# Patient Record
Sex: Male | Born: 1985 | Race: Black or African American | Hispanic: No | Marital: Married | Smoking: Former smoker
Health system: Southern US, Community
[De-identification: ages and names within clinical notes are randomized; demographics above are authoritative.]

## PROBLEM LIST (undated history)

## (undated) DIAGNOSIS — K5090 Crohn's disease, unspecified, without complications: Secondary | ICD-10-CM

## (undated) DIAGNOSIS — G47419 Narcolepsy without cataplexy: Secondary | ICD-10-CM

## (undated) DIAGNOSIS — K219 Gastro-esophageal reflux disease without esophagitis: Secondary | ICD-10-CM

## (undated) DIAGNOSIS — J45909 Unspecified asthma, uncomplicated: Secondary | ICD-10-CM

## (undated) HISTORY — PX: TONSILLECTOMY: SUR1361

---
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
## 2009-08-07 ENCOUNTER — Emergency Department (HOSPITAL_COMMUNITY): Admission: EM | Admit: 2009-08-07 | Discharge: 2009-08-07 | Payer: Self-pay | Admitting: Family Medicine

## 2010-04-11 ENCOUNTER — Encounter: Payer: Self-pay | Admitting: Internal Medicine

## 2013-03-23 ENCOUNTER — Emergency Department (INDEPENDENT_AMBULATORY_CARE_PROVIDER_SITE_OTHER)
Admission: EM | Admit: 2013-03-23 | Discharge: 2013-03-23 | Disposition: A | Discharge status: Home / Self Care | Payer: Self-pay | Source: Home / Self Care | Attending: Family Medicine | Admitting: Family Medicine

## 2013-03-23 ENCOUNTER — Encounter (HOSPITAL_COMMUNITY): Payer: Self-pay | Admitting: Emergency Medicine

## 2013-03-23 DIAGNOSIS — H6120 Impacted cerumen, unspecified ear: Secondary | ICD-10-CM

## 2013-03-23 DIAGNOSIS — H6123 Impacted cerumen, bilateral: Secondary | ICD-10-CM

## 2013-03-23 NOTE — Discharge Instructions (Signed)
Return as needed

## 2013-03-23 NOTE — ED Provider Notes (Signed)
CSN: [id number]     Arrival date & time 03/23/13  1029 History   First MD Initiated Contact with Patient 03/23/13 1226     Chief Complaint  Patient presents with  . Otalgia   (Consider location/radiation/quality/duration/timing/severity/associated sxs/prior Treatment) Patient is a 28 y.o. male presenting with ear pain. The history is provided by the patient.  Otalgia Location:  Bilateral Behind ear:  No abnormality Quality:  Dull Severity:  No pain Onset quality:  Gradual Duration:  1 week Progression:  Unchanged Chronicity:  New Associated symptoms: no ear discharge and no fever     History reviewed. No pertinent past medical history. History reviewed. No pertinent past surgical history. History reviewed. No pertinent family history. History  Substance Use Topics  . Smoking status: Never Smoker   . Smokeless tobacco: Not on file  . Alcohol Use: Yes    Review of Systems  Constitutional: Negative.  Negative for fever.  HENT: Positive for ear pain. Negative for ear discharge.   Eyes: Negative.     Allergies  Review of patient's allergies indicates no known allergies.  Home Medications  No current outpatient prescriptions on file. BP 136/89  Pulse 66  Temp(Src) 97.3 F (36.3 C) (Oral)  Resp 18  SpO2 97% Physical Exam  Nursing note and vitals reviewed. Constitutional: He appears well-developed and well-nourished. No distress.  HENT:  Head: Normocephalic.  Right Ear: No drainage, swelling or tenderness. Decreased hearing is noted.  Left Ear: No drainage, swelling or tenderness. Decreased hearing is noted.  Ears:  Nose: Nose normal.  Mouth/Throat: Oropharynx is clear and moist.    ED Course  Procedures (including critical care time) Labs Review Labs Reviewed - No data to display Imaging Review No results found.  EKG Interpretation    Date/Time:    Ventricular Rate:    PR Interval:    QRS Duration:   QT Interval:    QTC Calculation:   R Axis:       Text Interpretation:              MDM  Sx and hearing improved after irrg cleaning.    Billy Fischer, MD 03/23/13 1235

## 2013-03-23 NOTE — ED Notes (Signed)
Pt  Reports  Pain  r  Earache      With  Pressure  Sensation    Wax  Buildup            And  Decreased    Hearing  In the  Affected  Ear        Symptoms  X  About  1  Week   unreleived  By  [employer]

## 2015-01-27 DIAGNOSIS — E66813 Obesity, class 3: Secondary | ICD-10-CM | POA: Insufficient documentation

## 2015-01-27 DIAGNOSIS — K5010 Crohn's disease of large intestine without complications: Secondary | ICD-10-CM | POA: Insufficient documentation

## 2015-06-08 DIAGNOSIS — G4710 Hypersomnia, unspecified: Secondary | ICD-10-CM | POA: Insufficient documentation

## 2015-07-31 ENCOUNTER — Encounter (HOSPITAL_COMMUNITY): Payer: Self-pay | Admitting: *Deleted

## 2015-07-31 ENCOUNTER — Inpatient Hospital Stay (HOSPITAL_COMMUNITY)
Admission: EM | Admit: 2015-07-31 | Discharge: 2015-08-04 | DRG: 059 | Disposition: A | Discharge status: Home / Self Care | Payer: 59 | Attending: Internal Medicine | Admitting: Internal Medicine

## 2015-07-31 ENCOUNTER — Emergency Department (HOSPITAL_COMMUNITY)
Admission: EM | Admit: 2015-07-31 | Discharge: 2015-07-31 | Disposition: A | Discharge status: Home / Self Care | Payer: 59 | Source: Home / Self Care | Attending: Emergency Medicine | Admitting: Emergency Medicine

## 2015-07-31 ENCOUNTER — Emergency Department (HOSPITAL_COMMUNITY): Payer: 59

## 2015-07-31 ENCOUNTER — Encounter (HOSPITAL_COMMUNITY): Payer: Self-pay | Admitting: Emergency Medicine

## 2015-07-31 DIAGNOSIS — K50919 Crohn's disease, unspecified, with unspecified complications: Secondary | ICD-10-CM | POA: Diagnosis not present

## 2015-07-31 DIAGNOSIS — G47419 Narcolepsy without cataplexy: Secondary | ICD-10-CM | POA: Diagnosis present

## 2015-07-31 DIAGNOSIS — K219 Gastro-esophageal reflux disease without esophagitis: Secondary | ICD-10-CM | POA: Diagnosis present

## 2015-07-31 DIAGNOSIS — E669 Obesity, unspecified: Secondary | ICD-10-CM | POA: Insufficient documentation

## 2015-07-31 DIAGNOSIS — H53412 Scotoma involving central area, left eye: Secondary | ICD-10-CM | POA: Insufficient documentation

## 2015-07-31 DIAGNOSIS — H469 Unspecified optic neuritis: Secondary | ICD-10-CM | POA: Insufficient documentation

## 2015-07-31 DIAGNOSIS — K5010 Crohn's disease of large intestine without complications: Secondary | ICD-10-CM | POA: Diagnosis present

## 2015-07-31 DIAGNOSIS — H4612 Retrobulbar neuritis, left eye: Secondary | ICD-10-CM

## 2015-07-31 DIAGNOSIS — K5090 Crohn's disease, unspecified, without complications: Secondary | ICD-10-CM | POA: Diagnosis present

## 2015-07-31 DIAGNOSIS — G35 Multiple sclerosis: Secondary | ICD-10-CM | POA: Diagnosis not present

## 2015-07-31 DIAGNOSIS — H538 Other visual disturbances: Secondary | ICD-10-CM | POA: Diagnosis not present

## 2015-07-31 DIAGNOSIS — Z79899 Other long term (current) drug therapy: Secondary | ICD-10-CM

## 2015-07-31 DIAGNOSIS — H539 Unspecified visual disturbance: Secondary | ICD-10-CM | POA: Insufficient documentation

## 2015-07-31 HISTORY — DX: Narcolepsy without cataplexy: G47.419

## 2015-07-31 HISTORY — DX: Crohn's disease, unspecified, without complications: K50.90

## 2015-07-31 HISTORY — DX: Gastro-esophageal reflux disease without esophagitis: K21.9

## 2015-07-31 LAB — CBC WITH DIFFERENTIAL/PLATELET
Basophils Absolute: 0 10*3/uL (ref 0.0–0.1)
Basophils Relative: 1 %
Eosinophils Absolute: 0.2 10*3/uL (ref 0.0–0.7)
Eosinophils Relative: 4 %
HCT: 39.4 % (ref 39.0–52.0)
Hemoglobin: 12.9 g/dL — ABNORMAL LOW (ref 13.0–17.0)
Lymphocytes Relative: 63 %
Lymphs Abs: 3.3 10*3/uL (ref 0.7–4.0)
MCH: 26 pg (ref 26.0–34.0)
MCHC: 32.7 g/dL (ref 30.0–36.0)
MCV: 79.3 fL (ref 78.0–100.0)
Monocytes Absolute: 0.3 10*3/uL (ref 0.1–1.0)
Monocytes Relative: 6 %
Neutro Abs: 1.3 10*3/uL — ABNORMAL LOW (ref 1.7–7.7)
Neutrophils Relative %: 26 %
Platelets: 291 10*3/uL (ref 150–400)
RBC: 4.97 MIL/uL (ref 4.22–5.81)
RDW: 13.4 % (ref 11.5–15.5)
WBC: 5.2 10*3/uL (ref 4.0–10.5)

## 2015-07-31 LAB — I-STAT CHEM 8, ED
BUN: 15 mg/dL (ref 6–20)
Calcium, Ion: 1.2 mmol/L (ref 1.12–1.23)
Chloride: 102 mmol/L (ref 101–111)
Creatinine, Ser: 1.2 mg/dL (ref 0.61–1.24)
Glucose, Bld: 76 mg/dL (ref 65–99)
HCT: 43 % (ref 39.0–52.0)
Hemoglobin: 14.6 g/dL (ref 13.0–17.0)
Potassium: 3.5 mmol/L (ref 3.5–5.1)
Sodium: 142 mmol/L (ref 135–145)
TCO2: 27 mmol/L (ref 0–100)

## 2015-07-31 MED ORDER — ONDANSETRON HCL 4 MG PO TABS: 4.0000 mg | ORAL_TABLET | Freq: Four times a day (QID) | ORAL | Status: DC | PRN

## 2015-07-31 MED ORDER — MODAFINIL 100 MG PO TABS
200.0000 mg | ORAL_TABLET | Freq: Every day | ORAL | Status: DC
  Administered 2015-08-01 – 2015-08-04 (×4): 200 mg via ORAL
  Filled 2015-07-31 (×4): qty 2

## 2015-07-31 MED ORDER — LORATADINE 10 MG PO TABS
10.0000 mg | ORAL_TABLET | Freq: Every day | ORAL | Status: DC
  Administered 2015-08-01 – 2015-08-04 (×4): 10 mg via ORAL
  Filled 2015-07-31 (×4): qty 1

## 2015-07-31 MED ORDER — TETRACAINE HCL 0.5 % OP SOLN
1.0000 [drp] | Freq: Once | OPHTHALMIC | Status: AC
Start: 2015-07-31 — End: 2015-07-31
  Administered 2015-07-31: 1 [drp] via OPHTHALMIC

## 2015-07-31 MED ORDER — ENOXAPARIN SODIUM 40 MG/0.4ML ~~LOC~~ SOLN: 40.0000 mg | SUBCUTANEOUS | Status: DC

## 2015-07-31 MED ORDER — SODIUM CHLORIDE 0.9 % IV SOLN
1000.0000 mg | Freq: Every day | INTRAVENOUS | Status: DC
  Administered 2015-07-31 – 2015-08-03 (×4): 1000 mg via INTRAVENOUS
  Filled 2015-07-31 (×6): qty 8

## 2015-07-31 MED ORDER — FAMOTIDINE 20 MG PO TABS: 20.0000 mg | ORAL_TABLET | Freq: Two times a day (BID) | ORAL | Status: DC

## 2015-07-31 MED ORDER — FLUORESCEIN SODIUM 1 MG OP STRP
1.0000 | ORAL_STRIP | Freq: Once | OPHTHALMIC | Status: AC
  Administered 2015-07-31: 1 via OPHTHALMIC

## 2015-07-31 MED ORDER — FLUORESCEIN SODIUM 1 MG OP STRP
ORAL_STRIP | OPHTHALMIC | Status: AC
  Filled 2015-07-31: qty 1

## 2015-07-31 MED ORDER — SODIUM CHLORIDE 0.9% FLUSH
3.0000 mL | Freq: Two times a day (BID) | INTRAVENOUS | Status: DC
  Administered 2015-08-02 – 2015-08-04 (×5): 3 mL via INTRAVENOUS

## 2015-07-31 MED ORDER — ONDANSETRON HCL 4 MG/2ML IJ SOLN: 4.0000 mg | Freq: Four times a day (QID) | INTRAMUSCULAR | Status: DC | PRN

## 2015-07-31 MED ORDER — SODIUM CHLORIDE 0.9 % IV SOLN
INTRAVENOUS | Status: DC
  Administered 2015-08-01 (×2): via INTRAVENOUS
  Administered 2015-08-02: 125 mL/h via INTRAVENOUS
  Administered 2015-08-04: 03:00:00 via INTRAVENOUS

## 2015-07-31 MED ORDER — ACETAMINOPHEN ER 650 MG PO TBCR: 650.0000 mg | EXTENDED_RELEASE_TABLET | ORAL | Status: DC

## 2015-07-31 NOTE — ED Notes (Signed)
Pt was here last night due to vision loss in left eye. Pt reports able to see peripheral only. Had eye exam and was referred to opthamologist today. Was told by dr Dina Rich that if eye exam was neg to return here for possible MRI.

## 2015-07-31 NOTE — ED Notes (Signed)
MD at bedside performing eye exam

## 2015-07-31 NOTE — H&P (Signed)
History and Physical    Ricardo [name] ZES:[id number] DOB: Jan 28, 1986 DOA: 07/31/2015  Referring MD/NP/PA:   PCP: Ricardo Rima, MD   Patient coming from:  The patient is coming from home. At his baseline, he is independent for his ADL.  Chief Complaint: Blurry vision  HPI: Ricardo [name] is a 30 y.o. male with medical history significant of narcolepsy, Crohn's disease, GERD, who presents with blurred vision.  Patient reports that he has been having blurry vision in left eye for 3 weeks. His blurry of vision worsens on 05/30/2015. He was seen in the emergency room and was given referral to ophthalmologist. He was seen by eye doctor, Dr. Posey [name], who did not think patient's symptoms are due to eye problems. Therefore patient comes back to the emergency room today. He reports that he had central vision loss last night, but did not have unilateral weakness, numbness or tingling sensations in his extremities. No hearing loss. Patient does not have chest pain, cough, shortness breath, fever, chills, nausea, vomiting, diarrhea, symptoms of UTI. No rashes. Of note, patient is getting Remicade injection for Crohn's disease. His GI symptoms have been well controlled.  ED Course: pt was found to have WBC 5.2, temperature normal, no tachycardia, no tachypnea, creatinine 1.2. MRI of the brain showed a 6 mm focus of abnormal T2 and FLAIR signal within the corpus callosum, concerning for possibility of demyelinating disease/ multiple sclerosis. Pt is placed on tele bed for obs. Neurology, Dr. Nicole [name] was consulted by EDP.  Review of Systems:   General: no fevers, chills, no changes in body weight, has fatigue HEENT: no blurry vision, hearing changes or sore throat Pulm: no dyspnea, coughing, wheezing CV: no chest pain, no palpitations Abd: no nausea, vomiting, abdominal pain, diarrhea, constipation GU: no dysuria, burning on urination, increased urinary frequency, hematuria  Ext: no leg edema Neuro: no  unilateral weakness, numbness, or tingling, has blurry vision change, no hearing loss Skin: no rash MSK: No muscle spasm, no deformity, no limitation of range of movement in spin Heme: No easy bruising.  Travel history: No recent long distant travel.  Allergy: No Known Allergies  Past Medical History  Diagnosis Date  . Crohn disease ([place])   . Narcolepsy   . GERD (gastroesophageal reflux disease)     Past Surgical History  Procedure Laterality Date  . Tonsillectomy      Social History:  reports that he has never smoked. He does not have any smokeless tobacco history on file. He reports that he drinks alcohol. His drug history is not on file.  Family History:  Family History  Problem Relation Age of Onset  . Hyperlipidemia Mother   . Hypertension Mother   . Diabetes Mother   . Diabetes Father   . Hyperlipidemia Father   . Hypertension Father   . Diabetes Sister      Prior to Admission medications   Medication Sig Start Date End Date Taking? Authorizing Provider  acetaminophen (RA ACETAMINOPHEN) 650 MG CR tablet Take 650 mg by mouth every 8 (eight) weeks. With infusion   Yes Historical Provider, MD  Armodafinil 150 MG tablet Take 150 mg by mouth daily.   Yes Historical Provider, MD  cetirizine (ZYRTEC) 10 MG tablet Take 10 mg by mouth every 8 (eight) weeks. With infusion   Yes Historical Provider, MD  famotidine (PEPCID) 20 MG tablet Take 20 mg by mouth 2 (two) times daily.   Yes Historical Provider, MD  inFLIXimab (REMICADE) 100 MG injection  Inject into the vein every 8 (eight) weeks.   Yes Historical Provider, MD    Physical Exam: Filed Vitals:   07/31/15 2104 07/31/15 2345 08/01/15 0000 08/01/15 0017  BP: 128/88 114/67 116/89   Pulse: 57 59 63   Temp:    98.1 F (36.7 C)  TempSrc:      Resp: 20     SpO2: 97% 99% 100%    General: Not in acute distress HEENT:       Eyes: PERRL, EOMI, no scleral icterus. Has decreased acuity in the left eye.       ENT: No  discharge from the ears and nose, no pharynx injection, no tonsillar enlargement.        Neck: No JVD, no bruit, no mass felt. Heme: No neck lymph node enlargement. Cardiac: S1/S2, RRR, No murmurs, No gallops or rubs. Pulm:  No rales, wheezing, rhonchi or rubs. Abd: Soft, nondistended, nontender, no rebound pain, no organomegaly, BS present. GU: No hematuria Ext: No pitting leg edema bilaterally. 2+DP/PT pulse bilaterally. Musculoskeletal: No joint deformities, No joint redness or warmth, no limitation of ROM in spin. Skin: No rashes.  Neuro: Alert, oriented X3, cranial nerves II-XII grossly intact, moves all extremities normally. Muscle strength 5/5 in all extremities, sensation to light touch intact. Knee reflex 1+ bilaterally. Negative Babinski's sign. Normal finger to nose test. Psych: Patient is not psychotic, no suicidal or hemocidal ideation.  Labs on Admission: I have personally reviewed following labs and imaging studies  CBC:  Recent Labs Lab 07/31/15 2208 07/31/15 2237  WBC 5.2  --   NEUTROABS 1.3*  --   HGB 12.9* 14.6  HCT 39.4 43.0  MCV 79.3  --   PLT 291  --    Basic Metabolic Panel:  Recent Labs Lab 07/31/15 2237  NA 142  K 3.5  CL 102  GLUCOSE 76  BUN 15  CREATININE 1.20   GFR: Estimated Creatinine Clearance: 119.3 mL/min (by C-G formula based on Cr of 1.2). Liver Function Tests: No results for input(s): AST, ALT, ALKPHOS, BILITOT, PROT, ALBUMIN in the last 168 hours. No results for input(s): LIPASE, AMYLASE in the last 168 hours. No results for input(s): AMMONIA in the last 168 hours. Coagulation Profile: No results for input(s): INR, PROTIME in the last 168 hours. Cardiac Enzymes: No results for input(s): CKTOTAL, CKMB, CKMBINDEX, TROPONINI in the last 168 hours. BNP (last 3 results) No results for input(s): PROBNP in the last 8760 hours. HbA1C: No results for input(s): HGBA1C in the last 72 hours. CBG: No results for input(s): GLUCAP in the  last 168 hours. Lipid Profile: No results for input(s): CHOL, HDL, LDLCALC, TRIG, CHOLHDL, LDLDIRECT in the last 72 hours. Thyroid Function Tests: No results for input(s): TSH, T4TOTAL, FREET4, T3FREE, THYROIDAB in the last 72 hours. Anemia Panel: No results for input(s): VITAMINB12, FOLATE, FERRITIN, TIBC, IRON, RETICCTPCT in the last 72 hours. Urine analysis: No results found for: COLORURINE, APPEARANCEUR, LABSPEC, PHURINE, GLUCOSEU, HGBUR, BILIRUBINUR, KETONESUR, PROTEINUR, UROBILINOGEN, NITRITE, LEUKOCYTESUR Sepsis Labs: @LABRCNTIP (procalcitonin:4,lacticidven:4) )No results found for this or any previous visit (from the past 240 hour(s)).   Radiological Exams on Admission: Mr Brain Wo Contrast  07/31/2015  CLINICAL DATA:  Acute presentation with blurred vision of the left eye beginning 3 weeks ago, worsening today. Mild pain. Headaches. EXAM: MRI HEAD WITHOUT CONTRAST TECHNIQUE: Multiplanar, multiecho pulse sequences of the brain and surrounding structures were obtained without intravenous contrast. COMPARISON:  None. FINDINGS: Diffusion imaging does not show any acute  or subacute infarction. The brainstem and cerebellum are normal. Cerebral hemispheres are normal except for a 6 mm focus of abnormal FLAIR and T2 signal within the corpus callosum just to the left of midline. No other white matter lesions are seen. No cortical abnormality. No mass lesion, hemorrhage, hydrocephalus or extra-axial collection. No pituitary mass. No inflammatory sinus disease. No skull or skullbase lesion. No orbital abnormality is seen.  Both globes appear grossly normal. IMPRESSION: The only abnormality is a 6 mm focus of abnormal T2 and FLAIR signal within the corpus callosum. Even though this as an isolated finding, in a person of this age, it raises possibility of demyelinating disease/ multiple sclerosis. Electronically Signed   By: Nelson Chimes M.D.   On: 07/31/2015 20:59     EKG: Not done in ED, will get  one.   Assessment/Plan Active Problems:   Blurry vision, left eye   Crohn disease ([place])   Narcolepsy   GERD (gastroesophageal reflux disease)  Blurry vision in left eye: MRI findings are concerning for a myelinating disease. Neurology, Dr. Nicole [name] was consulted. Per dr. 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 There are insufficient clinical findings to support a diagnosis of demyelinating disorder.  -Place patient on telemetry bed for observation -Appreciate Dr. Nicole [name] consultation, will follow-up recommendations as follows:  1. High-dose steroid treatment with Solu-Medrol 1000 mg per day for 3 days, followed by prednisone taper starting at 60 mg per day and tapering by 10 mg per day  2. Protonix 40 mg per day while on steroid treatment  3. Consider lumbar puncture for oligoclonal bands screening -Check ESR, CRP, ANA -Frequent neuro checks  Hx of Crohn disease [facility]): Patient has been followed up by PCP Dr. Lavone Neri and GI, Dr. Renee Harder. He is getting Remicade injection. His symptoms are well controlled. No nausea, vomiting, diarrhea or abdominal pain now. -Follow-up with PCP and GI doctor.   Narcolepsy: -continue home Armodafinil  GERD (gastroesophageal reflux disease): -Switch home pepcid to protonix while pt is hight dose of seroide per dr. Nicole [name].    DVT ppx: SCD (in case pt need LP) Code Status: Full code Family Communication:  Yes, patient's wife and father  at bed side Disposition Plan:  Anticipate discharge back to previous home environment Consults called:  Neurology, Dr. Nicole [name] Admission status: Obs / tele   Date of Service 08/01/2015    Ivor Costa [facility] Pager [phone]  If 7PM-7AM, please contact night-coverage [email or web address] Password [email or web address] 08/01/2015, 1:05 AM

## 2015-07-31 NOTE — ED Provider Notes (Addendum)
CSN: [id number]     Arrival date & time 07/31/15  0049 History  By signing my name below, I, Irene Pap, attest that this documentation has been prepared under the direction and in the presence of Merryl Hacker, MD. Electronically Signed: Irene Pap, ED Scribe. 07/31/2015. 3:20 AM.   Chief Complaint  Patient presents with  . Eye Problem   The history is provided by the patient. No language interpreter was used.  HPI Comments: Ricardo [name] is a 30 y.o. male who presents to the Emergency Department complaining of blurred vision of the left eye onset 3 weeks ago, worsening today. He reports mild left eye pain.  No headache. He states, "it first started as a horizontal line, but it has expanded." He states that he can see around the "blacked out" area of vision in the center of the eye with no color changes. Pt wears glasses. Pt has not seen an ophthalmologist for this. Pt denies personal hx of DM, double vision, eye injury, weakness, numbness, or speech difficulty.   History reviewed. No pertinent past medical history. History reviewed. No pertinent past surgical history. No family history on file. Social History  Substance Use Topics  . Smoking status: Never Smoker   . Smokeless tobacco: None  . Alcohol Use: Yes    Review of Systems  Eyes: Positive for pain and visual disturbance.  Neurological: Negative for speech difficulty, weakness, numbness and headaches.  All other systems reviewed and are negative.  Allergies  Review of patient's allergies indicates no known allergies.  Home Medications   Prior to Admission medications   Medication Sig Start Date End Date Taking? Authorizing Provider  Armodafinil 150 MG tablet Take 150 mg by mouth daily.   Yes Historical Provider, MD  famotidine (PEPCID) 20 MG tablet Take 20 mg by mouth 2 (two) times daily.   Yes Historical Provider, MD  inFLIXimab in sodium chloride 0.9 % Inject into the vein See admin instructions. Bi monthly    Yes Historical Provider, MD   BP 131/78 mmHg  Pulse 80  Temp(Src) 97.6 F (36.4 C) (Oral)  Resp 20  Ht 5' 7"  (1.702 m)  Wt 288 lb 9 oz (130.891 kg)  BMI 45.18 kg/m2  SpO2 100% Physical Exam  Constitutional: He is oriented to person, place, and time. He appears well-developed and well-nourished.  obese  HENT:  Head: Normocephalic and atraumatic.  Eyes: Conjunctivae and EOM are normal. Pupils are equal, round, and reactive to light.  Pupils 4 mm and reactive bilaterally, pressure noted to be 23 in the right eye, 20 in the left eye, visual acuity 20/40 in the right eye, 20/200 in the left eye, limited nondilated funduscopic exam without acute abnormality, visual fields intact  Cardiovascular: Normal rate, regular rhythm and normal heart sounds.   No murmur heard. Pulmonary/Chest: Effort normal and breath sounds normal. No respiratory distress. He has no wheezes.  Abdominal: Soft. There is no tenderness.  Neurological: He is alert and oriented to person, place, and time. No cranial nerve deficit.  Skin: Skin is warm and dry.  Psychiatric: He has a normal mood and affect.  Nursing note and vitals reviewed.   ED Course  Procedures (including critical care time) DIAGNOSTIC STUDIES: Oxygen Saturation is 100% on RA, normal by my interpretation.    COORDINATION OF CARE: 3:20 AM-Discussed treatment plan which includes follow up with ophthalmologist with pt at bedside and pt agreed to plan.    Labs Review Labs Reviewed - No  data to display  Imaging Review No results found. I have personally reviewed and evaluated these images and lab results as part of my medical decision-making.   EKG Interpretation None      MDM   Final diagnoses:  Scotoma involving central area in visual field, left    Patient presents with left eye vision changes. Ongoing for the last 2-3 weeks. He describes a scotoma in the left eye. He reports occasional eye pain. No headache. Decreased visual acuity  in the left eye. Patient does wear glasses. He does not have an eye doctor. Visual fields appear intact. Floor seen exam and limited nondilated funduscopic exam is reassuring. Optic pressure is normal. Doubt clock coma. He is otherwise neurologically intact. Patient needs a formal dilated ophthalmologic exam. Discussed with patient considerations including macular degeneration, retinal issues, optic neuritis or MS or less likely stroke. Patient was offered MRI versus emergent ophthalmologic follow-up. Given the duration of his symptoms, doubt emergent MRI would change his outcome acutely. Discussed with patient and his wife implications of MS. They stated understanding. Patient would like to forego MRI imaging at this time and follow-up closely with ophthalmology. I discussed with the patient that if he has any worsening symptoms, is unable to follow-up with ophthalmology later today, or has any new symptoms he should be reevaluated immediately. Patient states understanding.  Discussed with Dr. Posey Pronto who will arrange urgent follow-up.  After history, exam, and medical workup I feel the patient has been appropriately medically screened and is safe for discharge home. Pertinent diagnoses were discussed with the patient. Patient was given return precautions.  I personally performed the services described in this documentation, which was scribed in my presence. The recorded information has been reviewed and is accurate.    Merryl Hacker, MD 07/31/15 7209  Merryl Hacker, MD 07/31/15 [phone]

## 2015-07-31 NOTE — ED Notes (Signed)
Pt. reports left eye blurred vision onset 3 weeks ago , denies eye injury or pain .

## 2015-07-31 NOTE — Consult Note (Signed)
Admission H&P    Chief Complaint: Blurred vision involving left eye.  HPI: Ricardo [name] is an 30 y.o. male with a history of Crohn's disease and narcolepsy presenting with blurred vision involving his left eye for 2 weeks. Blurring of vision worsens on 05/30/2015. He was seen in the emergency room at that time. Vision was 20/40  with the right eye and 20/200 left eye. He was seen by an ophthalmologist earlier today who found no abnormalities and recommended he return to the ED for further evaluation including MRI. Brain MRI showed a 6 mm focus abnormal T2 and FLAIR signal in the corpus callosum anteriorly. Significance of single lesion is unclear. Orbits and globes were unremarkable.  Past Medical History  Diagnosis Date  . Crohn disease ([place])   . Narcolepsy   . GERD (gastroesophageal reflux disease)     History reviewed. No pertinent past surgical history.  History reviewed. No pertinent family history. Social History:  reports that he has never smoked. He does not have any smokeless tobacco history on file. He reports that he drinks alcohol. His drug history is not on file.  Allergies: No Known Allergies  Medications: Preadmission medications were reviewed by me.  ROS: History obtained from the patient  General ROS: negative for - chills, fatigue, fever, night sweats, weight gain or weight loss Psychological ROS: negative for - behavioral disorder, hallucinations, memory difficulties, mood swings or suicidal ideation Ophthalmic ROS: negative for - blurry vision, double vision, eye pain or loss of vision ENT ROS: negative for - epistaxis, nasal discharge, oral lesions, sore throat, tinnitus or vertigo Allergy and Immunology ROS: negative for - hives or itchy/watery eyes Hematological and Lymphatic ROS: negative for - bleeding problems, bruising or swollen lymph nodes Endocrine ROS: negative for - galactorrhea, hair pattern changes, polydipsia/polyuria or temperature  intolerance Respiratory ROS: negative for - cough, hemoptysis, shortness of breath or wheezing Cardiovascular ROS: negative for - chest pain, dyspnea on exertion, edema or irregular heartbeat Gastrointestinal ROS: negative for - abdominal pain, diarrhea, hematemesis, nausea/vomiting or stool incontinence Genito-Urinary ROS: negative for - dysuria, hematuria, incontinence or urinary frequency/urgency Musculoskeletal ROS: negative for - joint swelling or muscular weakness Neurological ROS: as noted in HPI Dermatological ROS: negative for rash and skin lesion changes  Physical Examination: Blood pressure 128/88, pulse 57, temperature 98 F (36.7 C), temperature source Oral, resp. rate 20, SpO2 97 %.  HEENT-  Normocephalic, no lesions, without obvious abnormality.  Normal external eye and conjunctiva.  Normal TM's bilaterally.  Normal auditory canals and external ears. Normal external nose, mucus membranes and septum.  Normal pharynx. Neck supple with no masses, nodes, nodules or enlargement. Cardiovascular - regular rate and rhythm, S1, S2 normal, no murmur, click, rub or gallop Lungs - chest clear, no wheezing, rales, normal symmetric air entry Abdomen - soft, non-tender; bowel sounds normal; no masses,  no organomegaly Extremities - no joint deformities, effusion, or inflammation and no edema  Neurologic Examination: Mental Status: Alert, oriented, thought content appropriate.  Speech fluent without evidence of aphasia. Able to follow commands without difficulty. Cranial Nerves: II-Visual fields were normal. III/IV/VI-Pupils were equal and reacted normally to light. No afferent defect noted involving left eye. Extraocular movements were full and conjugate.    V/VII-no facial numbness and no facial weakness. VIII-normal. X-normal speech and symmetrical palatal movement. XI: trapezius strength/neck flexion strength normal bilaterally XII-midline tongue extension with normal  strength. Motor: 5/5 bilaterally with normal tone and bulk Sensory: Normal throughout. Deep Tendon Reflexes:  Trace to 1+ and symmetric in upper extremities and absent in lower extremities. Plantars: Flexor bilaterally Cerebellar: Normal finger-to-nose testing. Carotid auscultation: Normal  Results for orders placed or performed during the hospital encounter of 07/31/15 (from the past 48 hour(s))  CBC with Differential     Status: Abnormal   Collection Time: 07/31/15 10:08 PM  Result Value Ref Range   WBC 5.2 4.0 - 10.5 K/uL   RBC 4.97 4.22 - 5.81 MIL/uL   Hemoglobin 12.9 (L) 13.0 - 17.0 g/dL   HCT 39.4 39.0 - 52.0 %   MCV 79.3 78.0 - 100.0 fL   MCH 26.0 26.0 - 34.0 pg   MCHC 32.7 30.0 - 36.0 g/dL   RDW 13.4 11.5 - 15.5 %   Platelets 291 150 - 400 K/uL   Neutrophils Relative % 26 %   Neutro Abs 1.3 (L) 1.7 - 7.7 K/uL   Lymphocytes Relative 63 %   Lymphs Abs 3.3 0.7 - 4.0 K/uL   Monocytes Relative 6 %   Monocytes Absolute 0.3 0.1 - 1.0 K/uL   Eosinophils Relative 4 %   Eosinophils Absolute 0.2 0.0 - 0.7 K/uL   Basophils Relative 1 %   Basophils Absolute 0.0 0.0 - 0.1 K/uL  I-Stat Chem 8, ED     Status: None   Collection Time: 07/31/15 10:37 PM  Result Value Ref Range   Sodium 142 135 - 145 mmol/L   Potassium 3.5 3.5 - 5.1 mmol/L   Chloride 102 101 - 111 mmol/L   BUN 15 6 - 20 mg/dL   Creatinine, Ser 1.20 0.61 - 1.24 mg/dL   Glucose, Bld 76 65 - 99 mg/dL   Calcium, Ion 1.20 1.12 - 1.23 mmol/L   TCO2 27 0 - 100 mmol/L   Hemoglobin 14.6 13.0 - 17.0 g/dL   HCT 43.0 39.0 - 52.0 %   Mr Brain Wo Contrast  07/31/2015  CLINICAL DATA:  Acute presentation with blurred vision of the left eye beginning 3 weeks ago, worsening today. Mild pain. Headaches. EXAM: MRI HEAD WITHOUT CONTRAST TECHNIQUE: Multiplanar, multiecho pulse sequences of the brain and surrounding structures were obtained without intravenous contrast. COMPARISON:  None. FINDINGS: Diffusion imaging does not show any acute  or subacute infarction. The brainstem and cerebellum are normal. Cerebral hemispheres are normal except for a 6 mm focus of abnormal FLAIR and T2 signal within the corpus callosum just to the left of midline. No other white matter lesions are seen. No cortical abnormality. No mass lesion, hemorrhage, hydrocephalus or extra-axial collection. No pituitary mass. No inflammatory sinus disease. No skull or skullbase lesion. No orbital abnormality is seen.  Both globes appear grossly normal. IMPRESSION: The only abnormality is a 6 mm focus of abnormal T2 and FLAIR signal within the corpus callosum. Even though this as an isolated finding, in a person of this age, it raises possibility of demyelinating disease/ multiple sclerosis. Electronically Signed   By: Nelson Chimes M.D.   On: 07/31/2015 20:59    Assessment/Plan 30 year old with blurred vision involving left eye with reduced acuity and normal funduscopic exam, as well as a single white matter lesion on MRI shows possible demyelinating disorder. 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 There are insufficient clinical findings to support a diagnosis of demyelinating disorder.  Recommendations: 1. High-dose steroid treatment with Solu-Medrol 1000 mg per day for 3 days, followed by prednisone taper starting at 60 mg per day and tapering by 10  mg per day 2. Protonix 40 mg per day while on steroid treatment 3. Consider lumbar puncture for oligoclonal bands screening  We will continue to follow this patient with you  C.R. Nicole Kindred, [place] [facility] [phone]  07/31/2015, 11:55 PM

## 2015-07-31 NOTE — ED Provider Notes (Signed)
CSN: [id number]     Arrival date & time 07/31/15  1546 History   First MD Initiated Contact with Patient 07/31/15 1703     Chief Complaint  Patient presents with  . Eye Problem  . Visual Field Change     (Consider location/radiation/quality/duration/timing/severity/associated sxs/prior Treatment) HPI Ricardo [name] is a 30 y.o. male with no major medical problems, presents to emergency department complaining of visual changes in the left eye. Patient states his symptoms started approximately 2 weeks ago. This started as some horizontal lines in his central visual field and now expanded into a round area. Patient states symptoms are only in the left eye and he does not have any visual field deficiencies when he closes his left eye. He states that the center of his vision is blurry to the point where he cannot see even shapes. He can still see things and periphery. He denies any pain in his eye. He states he has had intermittent headache in the left side of his head for about a week. He does wear glasses. He does not have an ophthalmologist. He was seen in emergency department last night and was referred to ophthalmology. He had full eye exam today and was told it was normal and he needed to come back to the ER to rule out MS. Patient denies any prior similar symptoms. He denies any fever or chills. No head injury. No other complaints at this time.  History reviewed. No pertinent past medical history. History reviewed. No pertinent past surgical history. History reviewed. No pertinent family history. Social History  Substance Use Topics  . Smoking status: Never Smoker   . Smokeless tobacco: None  . Alcohol Use: Yes    Review of Systems  Constitutional: Negative for fever and chills.  Eyes: Positive for visual disturbance. Negative for photophobia, pain, discharge, redness and itching.  Respiratory: Negative for cough, chest tightness and shortness of breath.   Cardiovascular: Negative for  chest pain, palpitations and leg swelling.  Musculoskeletal: Negative for myalgias, arthralgias, neck pain and neck stiffness.  Skin: Negative for rash.  Allergic/Immunologic: Negative for immunocompromised state.  Neurological: Positive for headaches. Negative for dizziness, weakness, light-headedness and numbness.  All other systems reviewed and are negative.     Allergies  Review of patient's allergies indicates no known allergies.  Home Medications   Prior to Admission medications   Medication Sig Start Date End Date Taking? Authorizing Provider  Armodafinil 150 MG tablet Take 150 mg by mouth daily.    Historical Provider, MD  famotidine (PEPCID) 20 MG tablet Take 20 mg by mouth 2 (two) times daily.    Historical Provider, MD  inFLIXimab in sodium chloride 0.9 % Inject into the vein See admin instructions. Bi monthly    Historical Provider, MD   BP 133/76 mmHg  Pulse 68  Temp(Src) 98 F (36.7 C) (Oral)  Resp 18  SpO2 100% Physical Exam  Constitutional: He is oriented to person, place, and time. He appears well-developed and well-nourished. No distress.  HENT:  Head: Normocephalic and atraumatic.  Eyes: Conjunctivae and EOM are normal. Pupils are equal, round, and reactive to light.  Neck: Neck supple.  Cardiovascular: Normal rate, regular rhythm and normal heart sounds.   Pulmonary/Chest: Effort normal. No respiratory distress. He has no wheezes. He has no rales.  Musculoskeletal: He exhibits no edema.  Neurological: He is alert and oriented to person, place, and time.  Skin: Skin is warm and dry.  Nursing note and vitals  reviewed.   ED Course  Procedures (including critical care time) Labs Review Labs Reviewed - No data to display  Imaging Review Mr Brain Wo Contrast  07/31/2015  CLINICAL DATA:  Acute presentation with blurred vision of the left eye beginning 3 weeks ago, worsening today. Mild pain. Headaches. EXAM: MRI HEAD WITHOUT CONTRAST TECHNIQUE:  Multiplanar, multiecho pulse sequences of the brain and surrounding structures were obtained without intravenous contrast. COMPARISON:  None. FINDINGS: Diffusion imaging does not show any acute or subacute infarction. The brainstem and cerebellum are normal. Cerebral hemispheres are normal except for a 6 mm focus of abnormal FLAIR and T2 signal within the corpus callosum just to the left of midline. No other white matter lesions are seen. No cortical abnormality. No mass lesion, hemorrhage, hydrocephalus or extra-axial collection. No pituitary mass. No inflammatory sinus disease. No skull or skullbase lesion. No orbital abnormality is seen.  Both globes appear grossly normal. IMPRESSION: The only abnormality is a 6 mm focus of abnormal T2 and FLAIR signal within the corpus callosum. Even though this as an isolated finding, in a person of this age, it raises possibility of demyelinating disease/ multiple sclerosis. Electronically Signed   By: Nelson Chimes M.D.   On: 07/31/2015 20:59   I have personally reviewed and evaluated these images and lab results as part of my medical decision-making.   EKG Interpretation None      MDM   Final diagnoses:  Vision changes   Patient returns to emergency department after being seen last night for changes in his left eye visual field. He had full ophthalmologic: Exam today which was normal. He was sent back to the ER for MRI of the brain to rule out MS, since ophthalmologic all problems usually require IV treatment. Patient has no other complaints. He is in no distress. MR brain ordered. I discussed with radiologist, no IV contrast needed at this time.   9:24 PM Pt's MR showing a 52m focus of abnormal T2 and flair signal within corpus callosum. Raises concern for demyelinating disease. Spoke with Dr. SNicole Kindredwith neurology, will consult. Will admit to medicine for IV solumedrol and further evaluation.   Filed Vitals:   07/31/15 1607 07/31/15 2104  BP: 133/76  128/88  Pulse: 68 57  Temp: 98 F (36.7 C)   TempSrc: Oral   Resp: 18 20  SpO2: 100% 97%     TJeannett Senior PA-C 07/31/15 [place] MD 08/01/15 1554

## 2015-07-31 NOTE — Discharge Instructions (Signed)
You were seen today for vision changes in your left eye. This is been ongoing for several weeks. You describe what is called a scotoma. There are several causes of scotoma. Your initial eye exam is reassuring. However, you do need to see an ophthalmologist for a formal eye exam. You may also need an MRI. I  Scotoma Scotoma refers to an area of decreased or absent eyesight within your field of vision. This is sometimes called a blind spot. It may appear as a dark or gray area of vision loss (visual field defect) that is surrounded by a ring or an area of blurred vision. There are two types of scotoma:  Central scotoma. In this type, the blind spot is in the middle of the field of vision. A central scotoma may be caused by a problem with your main nerve for vision (optic nerve). It may also be caused by a common eye disease of older age (macular degeneration).  Peripheral scotoma. In this type, the blind spot is at the edge of the field of vision. A peripheral scotoma is often caused by a problem with the cells in the back of your eye (retina) that collect images and send them to your brain through the optic nerve. Other causes of scotoma include multiple sclerosis, stroke, migraine aura, a blood clot to the vessels of the eye, glaucoma, or anything that may block normal light transmission and perception by the retina. To find the cause, you may need to see a health care provider who specializes in eye conditions (ophthalmologist). HOME CARE INSTRUCTIONS  Tell your health care provider about any changes in your scotoma.  Do not drive or operate heavy machinery unless your health care provider approves.  Work with a Psychologist, educational as directed by your health care provider.  When you are reading or doing other activities that involve small objects or small text, try to use good lighting, large print, and magnifying devices.  Take medicines only as directed by your health care provider.  Keep all  follow-up visits as directed by your health care provider. This is important. SEEK MEDICAL CARE IF:  Your condition changes.  Your symptoms get worse.  You develop other symptoms, including:  Weakness.  Numbness.  Headache.  Eye pain.  Clumsiness.  Flashing lights in your field of vision.  Shadowy shapes that move across your field of vision (floaters).  Eye redness. SEEK IMMEDIATE MEDICAL CARE IF:  You have a sudden loss of vision.  You have a sudden severe headache.  You have sudden weakness or numbness.  You lose the ability to speak, understand speech, or both.   This information is not intended to replace advice given to you by your health care provider. Make sure you discuss any questions you have with your health care provider.   Document Released: 04/14/2004 Document Revised: 07/22/2014 Document Reviewed: 01/29/2014 Elsevier Interactive Patient Education [employer].

## 2015-07-31 NOTE — ED Notes (Signed)
Patient left at this time with all belongings. 

## 2015-08-01 ENCOUNTER — Encounter (HOSPITAL_COMMUNITY): Payer: Self-pay | Admitting: Internal Medicine

## 2015-08-01 DIAGNOSIS — K219 Gastro-esophageal reflux disease without esophagitis: Secondary | ICD-10-CM | POA: Diagnosis present

## 2015-08-01 DIAGNOSIS — H469 Unspecified optic neuritis: Secondary | ICD-10-CM | POA: Diagnosis present

## 2015-08-01 DIAGNOSIS — Z79899 Other long term (current) drug therapy: Secondary | ICD-10-CM | POA: Diagnosis not present

## 2015-08-01 DIAGNOSIS — H538 Other visual disturbances: Secondary | ICD-10-CM | POA: Diagnosis not present

## 2015-08-01 DIAGNOSIS — K5090 Crohn's disease, unspecified, without complications: Secondary | ICD-10-CM | POA: Diagnosis present

## 2015-08-01 DIAGNOSIS — G47419 Narcolepsy without cataplexy: Secondary | ICD-10-CM | POA: Diagnosis present

## 2015-08-01 DIAGNOSIS — K50919 Crohn's disease, unspecified, with unspecified complications: Secondary | ICD-10-CM | POA: Diagnosis not present

## 2015-08-01 DIAGNOSIS — G35 Multiple sclerosis: Secondary | ICD-10-CM | POA: Diagnosis present

## 2015-08-01 LAB — COMPREHENSIVE METABOLIC PANEL
ALK PHOS: 46 U/L (ref 38–126)
ALT: 46 U/L (ref 17–63)
AST: 50 U/L — AB (ref 15–41)
Albumin: 3.6 g/dL (ref 3.5–5.0)
Anion gap: 9 (ref 5–15)
BILIRUBIN TOTAL: 0.6 mg/dL (ref 0.3–1.2)
BUN: 14 mg/dL (ref 6–20)
CALCIUM: 9 mg/dL (ref 8.9–10.3)
CO2: 25 mmol/L (ref 22–32)
CREATININE: 1.22 mg/dL (ref 0.61–1.24)
Chloride: 105 mmol/L (ref 101–111)
GFR calc Af Amer: >60 mL/min (ref >60)
Glucose, Bld: 127 mg/dL — ABNORMAL HIGH (ref 65–99)
Potassium: 3.6 mmol/L (ref 3.5–5.1)
Sodium: 139 mmol/L (ref 135–145)
TOTAL PROTEIN: 7.4 g/dL (ref 6.5–8.1)

## 2015-08-01 LAB — CBC
HCT: 40 % (ref 39.0–52.0)
Hemoglobin: 13.4 g/dL (ref 13.0–17.0)
MCH: 27.1 pg (ref 26.0–34.0)
MCHC: 33.5 g/dL (ref 30.0–36.0)
MCV: 80.8 fL (ref 78.0–100.0)
PLATELETS: 303 10*3/uL (ref 150–400)
RBC: 4.95 MIL/uL (ref 4.22–5.81)
RDW: 13.6 % (ref 11.5–15.5)
WBC: 4.3 10*3/uL (ref 4.0–10.5)

## 2015-08-01 LAB — CBG MONITORING, ED: GLUCOSE-CAPILLARY: 128 mg/dL — AB (ref 65–99)

## 2015-08-01 MED ORDER — SODIUM CHLORIDE 0.9 % IV SOLN: 1000.0000 mg | Freq: Every day | INTRAVENOUS | Status: DC

## 2015-08-01 MED ORDER — PANTOPRAZOLE SODIUM 40 MG PO TBEC
40.0000 mg | DELAYED_RELEASE_TABLET | Freq: Every day | ORAL | Status: DC
  Administered 2015-08-01 – 2015-08-04 (×4): 40 mg via ORAL
  Filled 2015-08-01 (×4): qty 1

## 2015-08-01 MED ORDER — PANTOPRAZOLE SODIUM 40 MG PO TBEC: 40.0000 mg | DELAYED_RELEASE_TABLET | Freq: Every day | ORAL | Status: DC

## 2015-08-01 NOTE — ED Notes (Signed)
Pt c/o left hand numbness - intermittent. States has been occurring x months - worse today. No weakness noted.

## 2015-08-01 NOTE — ED Notes (Signed)
[place] MD paged and asked if pt could be a possible med surg bed.  MD returned page and reports he will consult with Neurology.

## 2015-08-01 NOTE — ED Notes (Addendum)
Monitor and SCD's re-applied to pt after pt ambulated to bathroom. Resting on bed watching tv. Spouse w/pt.

## 2015-08-01 NOTE — ED Notes (Signed)
Pt being transported to 5M07 by Shirlean Mylar, EMT w/telemetry. Spouse w/pt.

## 2015-08-01 NOTE — ED Notes (Signed)
Patient up to restroom.  Pt able to ambulate independently.  Denies dizziness, weakness, or difficulty in walking.

## 2015-08-01 NOTE — ED Notes (Signed)
scd's explained and applied.

## 2015-08-01 NOTE — Progress Notes (Signed)
Subjective: Started on solumedrol.   Exam: Filed Vitals:   08/01/15 1453 08/01/15 1549  BP:  164/90  Pulse:  83  Temp: 98.3 F (36.8 C)   Resp:  18   Gen: In bed, NAD Resp: non-labored breathing, no acute distress Abd: soft, nt  Neuro: MS: awake, alert, interactive and appropriate.  PG:[id number] vision in left eye, left APD. EOMI Motor: MAEW Sensory:intact to LT  Pertinent Labs: cmp - unremarkable  Impression: 30 yo M with optic neuritis. He has been started on steroids. I cannot feel his landmarks, so cannot safely perform LP at bedside.   Recommendations: 1) Solumedrol 1gm daily x 4 days.  2) LP under fluoro ordered. Could be done Monday.  3) will follow.   Roland Rack, MD [facility] [phone]  If 7pm- 7am, please page neurology on call as listed in [place].

## 2015-08-01 NOTE — ED Notes (Signed)
Pt family brought lunch for pt.  Okayed pt eating with Leonel Ramsay MD

## 2015-08-01 NOTE — ED Notes (Addendum)
Monitor reapplied to pt. SCD's also reapplied.

## 2015-08-01 NOTE — ED Notes (Addendum)
Admitting MD in w/pt. Aware pt c/o intermittent left hand numbness and that pt is NPO - requesting for RN to page neurology and ask if plan to perform LP and if not, pt may eat.

## 2015-08-01 NOTE — ED Notes (Signed)
Pt's wife provided with recliner and sleeping supplies.  Patient made comfortable, and all other family members left.

## 2015-08-01 NOTE — ED Notes (Signed)
Pt lying on bed talking w/family member.

## 2015-08-01 NOTE — Progress Notes (Signed)
PROGRESS NOTE    Ricardo [name]  QQP:[id number] DOB: [birth year] DOA: 07/31/2015 PCP: Helane Rima, MD  Outpatient Specialists:   Brief Narrative: 30 y.o. male with medical history significant of narcolepsy, Crohn's disease, GERD, who presents with 3 week history of blurry and tunneled vision. His blurry of vision worsens on 05/30/2015. He was seen in the emergency room and was referred to ophthalmologist.The patient was seen by the Ophthalmologist, Dr. Posey Pronto, who did not think patient's symptoms are due to eye problems. Some numbness of the extremity is also reported. The patient is on Remicade injection for Crohn's disease.    Assessment & Plan:   Active Problems:   Blurry vision, left eye   Crohn disease ([place])   Narcolepsy   GERD (gastroesophageal reflux disease)  Assessment/Plan Active Problems:  Blurry vision, left eye  Crohn disease ([place])  Narcolepsy  GERD (gastroesophageal reflux disease)  - Blurry vision in left eye: Neurology input is appreciated. Work up is in progress for optic neuritis/multiple sclerosis. Patient is on Pulse steroid therapy. For lumbar puncture and analysis of spinal fluid. MRI finding is noted. - Hx of Crohn disease ([place]): On Remicade injection. His symptoms are well controlled.   - Narcolepsy: continue home Armodafinil - GERD (gastroesophageal reflux disease):   DVT ppx: SCD (in case pt need LP) Code Status: Full code Family Communication: Yes, patient's wife and father at bed side Disposition Plan: Anticipate discharge back to previous home environment Consults called: Neurology, Dr. Nicole Kindred  Subjective: Nil new complaints  Objective: Filed Vitals:   08/01/15 0321 08/01/15 0345 08/01/15 0545 08/01/15 0804  BP: 121/54 116/55 138/76 130/67  Pulse: 65 66 68   Temp:      TempSrc:      Resp: 20 22 19 18   SpO2: 99% 99% 99% 99%   No intake or output data in the 24 hours ending 08/01/15 0813 There were no vitals filed for this  visit.  Examination:  General exam: Obese. Appears calm and comfortable  Respiratory system: Clear to auscultation. Respiratory effort normal. Cardiovascular system: S1 & S2. Gastrointestinal system: Abdomen is obese, soft and nontender.   Central nervous system: Alert and oriented. Moves all limbs. Extremities: No edema.   Data Reviewed: I have personally reviewed following labs and imaging studies  CBC:  Recent Labs Lab 07/31/15 2208 07/31/15 2237 08/01/15 0058  WBC 5.2  --  4.3  NEUTROABS 1.3*  --   --   HGB 12.9* 14.6 13.4  HCT 39.4 43.0 40.0  MCV 79.3  --  80.8  PLT 291  --  712   Basic Metabolic Panel:  Recent Labs Lab 07/31/15 2237 08/01/15 0058  NA 142 139  K 3.5 3.6  CL 102 105  CO2  --  25  GLUCOSE 76 127*  BUN 15 14  CREATININE 1.20 1.22  CALCIUM  --  9.0   GFR: Estimated Creatinine Clearance: 117.3 mL/min (by C-G formula based on Cr of 1.22). Liver Function Tests:  Recent Labs Lab 08/01/15 0058  AST 50*  ALT 46  ALKPHOS 46  BILITOT 0.6  PROT 7.4  ALBUMIN 3.6   No results for input(s): LIPASE, AMYLASE in the last 168 hours. No results for input(s): AMMONIA in the last 168 hours. Coagulation Profile: No results for input(s): INR, PROTIME in the last 168 hours. Cardiac Enzymes: No results for input(s): CKTOTAL, CKMB, CKMBINDEX, TROPONINI in the last 168 hours. BNP (last 3 results) No results for input(s): PROBNP in the last 8760  hours. HbA1C: No results for input(s): HGBA1C in the last 72 hours. CBG: No results for input(s): GLUCAP in the last 168 hours. Lipid Profile: No results for input(s): CHOL, HDL, LDLCALC, TRIG, CHOLHDL, LDLDIRECT in the last 72 hours. Thyroid Function Tests: No results for input(s): TSH, T4TOTAL, FREET4, T3FREE, THYROIDAB in the last 72 hours. Anemia Panel: No results for input(s): VITAMINB12, FOLATE, FERRITIN, TIBC, IRON, RETICCTPCT in the last 72 hours. Urine analysis: No results found for: COLORURINE,  APPEARANCEUR, LABSPEC, PHURINE, GLUCOSEU, HGBUR, BILIRUBINUR, KETONESUR, PROTEINUR, UROBILINOGEN, NITRITE, LEUKOCYTESUR Sepsis Labs: @LABRCNTIP (procalcitonin:4,lacticidven:4)  )No results found for this or any previous visit (from the past 240 hour(s)).       Radiology Studies: Mr Brain Wo Contrast  07/31/2015  CLINICAL DATA:  Acute presentation with blurred vision of the left eye beginning 3 weeks ago, worsening today. Mild pain. Headaches. EXAM: MRI HEAD WITHOUT CONTRAST TECHNIQUE: Multiplanar, multiecho pulse sequences of the brain and surrounding structures were obtained without intravenous contrast. COMPARISON:  None. FINDINGS: Diffusion imaging does not show any acute or subacute infarction. The brainstem and cerebellum are normal. Cerebral hemispheres are normal except for a 6 mm focus of abnormal FLAIR and T2 signal within the corpus callosum just to the left of midline. No other white matter lesions are seen. No cortical abnormality. No mass lesion, hemorrhage, hydrocephalus or extra-axial collection. No pituitary mass. No inflammatory sinus disease. No skull or skullbase lesion. No orbital abnormality is seen.  Both globes appear grossly normal. IMPRESSION: The only abnormality is a 6 mm focus of abnormal T2 and FLAIR signal within the corpus callosum. Even though this as an isolated finding, in a person of this age, it raises possibility of demyelinating disease/ multiple sclerosis. Electronically Signed   By: Nelson Chimes M.D.   On: 07/31/2015 20:59        Scheduled Meds: . loratadine  10 mg Oral Daily  . methylPREDNISolone (SOLU-MEDROL) injection  1,000 mg Intravenous Daily  . modafinil  200 mg Oral Q breakfast  . pantoprazole  40 mg Oral Q1200  . sodium chloride flush  3 mL Intravenous Q12H   Continuous Infusions: . sodium chloride 125 mL/hr at 08/01/15 0045        Time spent: 25 Mins    [name], MD [facility] Pager [phone] If 7PM-7AM,  please contact night-coverage [email or web address] Password [facility] 08/01/2015, 8:13 AM

## 2015-08-01 NOTE — ED Notes (Signed)
Per Dr Leonel Ramsay, prepare pt for LP. Pt and spouse aware. Per Cecille Rubin, Agricultural consultant - moved pt to D30 - report given to Jiles Harold, Therapist, sports.

## 2015-08-01 NOTE — ED Notes (Addendum)
Transported pt via bed to C29. Family w/pt. Pt alert, oriented, cooperative. States ate lunch. Pt wearing hospital gown and shorts. Pt has cell phone and charger.

## 2015-08-02 DIAGNOSIS — H469 Unspecified optic neuritis: Secondary | ICD-10-CM

## 2015-08-02 NOTE — Progress Notes (Signed)
PROGRESS NOTE    Ricardo [name]  MEQ:[id number] DOB: [birth year] DOA: 07/31/2015 PCP: Helane Rima, MD  Outpatient Specialists:   Brief Narrative: 30 y.o. male with medical history significant of narcolepsy, Crohn's disease, GERD, who presents with 3 week history of blurry and tunneled vision. His blurry of vision worsens on 05/30/2015. He was seen in the emergency room and was referred to ophthalmologist.The patient was seen by the Ophthalmologist, Dr. Posey Pronto, who did not think patient's symptoms are due to eye problems. Some numbness of the extremity is also reported. The patient is on Remicade injection for Crohn's disease.    Assessment & Plan:   Active Problems:   Blurry vision, left eye   Crohn disease ([place])   Narcolepsy   GERD (gastroesophageal reflux disease)   Multiple sclerosis ([facility])   Optic neuritis  Assessment/Plan Active Problems:  Blurry vision, left eye  Crohn disease ([place])  Narcolepsy  GERD (gastroesophageal reflux disease)  - Blurry vision in left eye/Tunnelled vision: Neurology input is appreciated. Work up is in progress for optic neuritis/multiple sclerosis. Patient is on Pulse steroid therapy. For lumbar puncture and analysis of spinal fluid in am.  - Hx of Crohn disease ([place]): On Remicade injection. His symptoms are well controlled.   - Narcolepsy: continue home Armodafinil - GERD (gastroesophageal reflux disease):   DVT ppx: SCD (in case pt need LP) Code Status: Full code Family Communication: Yes, patient's wife and father at bed side Disposition Plan: Anticipate discharge back to previous home environment Consults called: Neurology, Dr. Nicole Kindred  Subjective: Nil new complaints  Objective: Filed Vitals:   08/02/15 0652 08/02/15 0701 08/02/15 1005 08/02/15 1525  BP: 126/55 159/73 133/71 145/80  Pulse: 85 85 73 89  Temp: 97.9 F (36.6 C) 98.5 F (36.9 C) 98 F (36.7 C) 98.3 F (36.8 C)  TempSrc: Oral Oral Oral Oral  Resp: 20 20 20 20     Height:      Weight:      SpO2: 98% 100% 100% 100%    Intake/Output Summary (Last 24 hours) at 08/02/15 1540 Last data filed at 08/02/15 1526  Gross per 24 hour  Intake    460 ml  Output      0 ml  Net    460 ml   Filed Weights   08/01/15 1716  Weight: 135.3 kg (298 lb 4.5 oz)    Examination:  General exam: Obese. Appears calm and comfortable  Respiratory system: Clear to auscultation. Respiratory effort normal. Cardiovascular system: S1 & S2. Gastrointestinal system: Abdomen is obese, soft and nontender.   Central nervous system: Alert and oriented. Moves all limbs. Extremities: No edema.   Data Reviewed: I have personally reviewed following labs and imaging studies  CBC:  Recent Labs Lab 07/31/15 2208 07/31/15 2237 08/01/15 0058  WBC 5.2  --  4.3  NEUTROABS 1.3*  --   --   HGB 12.9* 14.6 13.4  HCT 39.4 43.0 40.0  MCV 79.3  --  80.8  PLT 291  --  297   Basic Metabolic Panel:  Recent Labs Lab 07/31/15 2237 08/01/15 0058  NA 142 139  K 3.5 3.6  CL 102 105  CO2  --  25  GLUCOSE 76 127*  BUN 15 14  CREATININE 1.20 1.22  CALCIUM  --  9.0   GFR: Estimated Creatinine Clearance: 119.6 mL/min (by C-G formula based on Cr of 1.22). Liver Function Tests:  Recent Labs Lab 08/01/15 0058  AST 50*  ALT 46  ALKPHOS 46  BILITOT 0.6  PROT 7.4  ALBUMIN 3.6   No results for input(s): LIPASE, AMYLASE in the last 168 hours. No results for input(s): AMMONIA in the last 168 hours. Coagulation Profile: No results for input(s): INR, PROTIME in the last 168 hours. Cardiac Enzymes: No results for input(s): CKTOTAL, CKMB, CKMBINDEX, TROPONINI in the last 168 hours. BNP (last 3 results) No results for input(s): PROBNP in the last 8760 hours. HbA1C: No results for input(s): HGBA1C in the last 72 hours. CBG:  Recent Labs Lab 08/01/15 0947  GLUCAP 128*   Lipid Profile: No results for input(s): CHOL, HDL, LDLCALC, TRIG, CHOLHDL, LDLDIRECT in the last 72  hours. Thyroid Function Tests: No results for input(s): TSH, T4TOTAL, FREET4, T3FREE, THYROIDAB in the last 72 hours. Anemia Panel: No results for input(s): VITAMINB12, FOLATE, FERRITIN, TIBC, IRON, RETICCTPCT in the last 72 hours. Urine analysis: No results found for: COLORURINE, APPEARANCEUR, LABSPEC, PHURINE, GLUCOSEU, HGBUR, BILIRUBINUR, KETONESUR, PROTEINUR, UROBILINOGEN, NITRITE, LEUKOCYTESUR Sepsis Labs: @LABRCNTIP (procalcitonin:4,lacticidven:4)  )No results found for this or any previous visit (from the past 240 hour(s)).       Radiology Studies: Mr Brain Wo Contrast  07/31/2015  CLINICAL DATA:  Acute presentation with blurred vision of the left eye beginning 3 weeks ago, worsening today. Mild pain. Headaches. EXAM: MRI HEAD WITHOUT CONTRAST TECHNIQUE: Multiplanar, multiecho pulse sequences of the brain and surrounding structures were obtained without intravenous contrast. COMPARISON:  None. FINDINGS: Diffusion imaging does not show any acute or subacute infarction. The brainstem and cerebellum are normal. Cerebral hemispheres are normal except for a 6 mm focus of abnormal FLAIR and T2 signal within the corpus callosum just to the left of midline. No other white matter lesions are seen. No cortical abnormality. No mass lesion, hemorrhage, hydrocephalus or extra-axial collection. No pituitary mass. No inflammatory sinus disease. No skull or skullbase lesion. No orbital abnormality is seen.  Both globes appear grossly normal. IMPRESSION: The only abnormality is a 6 mm focus of abnormal T2 and FLAIR signal within the corpus callosum. Even though this as an isolated finding, in a person of this age, it raises possibility of demyelinating disease/ multiple sclerosis. Electronically Signed   By: Nelson Chimes M.D.   On: 07/31/2015 20:59        Scheduled Meds: . loratadine  10 mg Oral Daily  . methylPREDNISolone (SOLU-MEDROL) injection  1,000 mg Intravenous Daily  . modafinil  200 mg Oral  Q breakfast  . pantoprazole  40 mg Oral Q1200  . sodium chloride flush  3 mL Intravenous Q12H   Continuous Infusions: . sodium chloride 125 mL/hr (08/02/15 0304)     LOS: 1 day    Time spent: 25 Mins    [name], MD [facility] Pager [phone] If 7PM-7AM, please contact night-coverage [email or web address] Password [facility] 08/02/2015, 3:40 PM

## 2015-08-02 NOTE — Progress Notes (Signed)
Interval History:                                                                                                                      Ricardo [name] is an 30 y.o. male patient with  left optic neuritis, receiving IV Solu-Medrol 1 g daily. Denies any significant change in his vision through the left eye. No side effects to Solu-Medrol. I'll be ordered through IR, pending. MRI of the brain showed a single tiny corpus callosum lesion.   No new neurological symptoms   Past Medical History: Past Medical History  Diagnosis Date  . Crohn disease ([place])   . Narcolepsy   . GERD (gastroesophageal reflux disease)     Past Surgical History  Procedure Laterality Date  . Tonsillectomy      Family History: Family History  Problem Relation Age of Onset  . Hyperlipidemia Mother   . Hypertension Mother   . Diabetes Mother   . Diabetes Father   . Hyperlipidemia Father   . Hypertension Father   . Diabetes Sister     Social History:   reports that he has never smoked. He does not have any smokeless tobacco history on file. He reports that he drinks alcohol. His drug history is not on file.  Allergies:  No Known Allergies   Medications:                                                                                                                         Current facility-administered medications:  .  0.9 %  sodium chloride infusion, , Intravenous, Continuous, Ivor Costa, MD, Last Rate: 125 mL/hr at 08/02/15 0304, 125 mL/hr at 08/02/15 0304 .  loratadine (CLARITIN) tablet 10 mg, 10 mg, Oral, Daily, Ivor Costa, MD, 10 mg at 08/02/15 0929 .  methylPREDNISolone sodium succinate (SOLU-MEDROL) 1,000 mg in sodium chloride 0.9 % 50 mL IVPB, 1,000 mg, Intravenous, Daily, Tatyana Kirichenko, PA-C, 1,000 mg at 08/01/15 2223 .  modafinil (PROVIGIL) tablet 200 mg, 200 mg, Oral, Q breakfast, Ivor Costa, MD, 200 mg at 08/02/15 1043 .  ondansetron (ZOFRAN) tablet 4 mg, 4 mg, Oral, Q6H PRN **OR** ondansetron  (ZOFRAN) injection 4 mg, 4 mg, Intravenous, Q6H PRN, Ivor Costa, MD .  pantoprazole (PROTONIX) EC tablet 40 mg, 40 mg, Oral, Q1200, Ivor Costa, MD, 40 mg at 08/02/15 1305 .  sodium chloride flush (NS) 0.9 % injection 3 mL, 3 mL, Intravenous, Q12H, Ivor Costa, MD, 3 mL at 08/02/15  1191   Neurologic Examination:                                                                                                     Today's Vitals   08/02/15 1005 08/02/15 1525 08/02/15 1940 08/02/15 1944  BP: 133/71 145/80  138/87  Pulse: 73 89  65  Temp: 98 F (36.7 C) 98.3 F (36.8 C)  98.4 F (36.9 C)  TempSrc: Oral Oral  Oral  Resp: 20 20  18   Height:      Weight:      SpO2: 100% 100%  100%  PainSc:   0-No pain     Evaluation of higher integrative functions including: Level of alertness: Alert,  Oriented to time, place and person Speech: fluent, no evidence of dysarthria or aphasia noted.  Test the following cranial nerves: 3-12 grossly intact, reduced visual acuity through the left eye consistent with left eye optic neuritis Motor examination:  full 5/5 motor strength in all 4 extremities   Lab Results: Basic Metabolic Panel:  Recent Labs Lab 07/31/15 2237 08/01/15 0058  NA 142 139  K 3.5 3.6  CL 102 105  CO2  --  25  GLUCOSE 76 127*  BUN 15 14  CREATININE 1.20 1.22  CALCIUM  --  9.0    Liver Function Tests:  Recent Labs Lab 08/01/15 0058  AST 50*  ALT 46  ALKPHOS 46  BILITOT 0.6  PROT 7.4  ALBUMIN 3.6   No results for input(s): LIPASE, AMYLASE in the last 168 hours. No results for input(s): AMMONIA in the last 168 hours.  CBC:  Recent Labs Lab 07/31/15 2208 07/31/15 2237 08/01/15 0058  WBC 5.2  --  4.3  NEUTROABS 1.3*  --   --   HGB 12.9* 14.6 13.4  HCT 39.4 43.0 40.0  MCV 79.3  --  80.8  PLT 291  --  303    Cardiac Enzymes: No results for input(s): CKTOTAL, CKMB, CKMBINDEX, TROPONINI in the last 168 hours.  Lipid Panel: No results for input(s): CHOL, TRIG,  HDL, CHOLHDL, VLDL, LDLCALC in the last 168 hours.  CBG:  Recent Labs Lab 08/01/15 0947  GLUCAP 128*    Microbiology: No results found for this or any previous visit.  Imaging: Mr Herby Abraham Contrast  07/31/2015  CLINICAL DATA:  Acute presentation with blurred vision of the left eye beginning 3 weeks ago, worsening today. Mild pain. Headaches. EXAM: MRI HEAD WITHOUT CONTRAST TECHNIQUE: Multiplanar, multiecho pulse sequences of the brain and surrounding structures were obtained without intravenous contrast. COMPARISON:  None. FINDINGS: Diffusion imaging does not show any acute or subacute infarction. The brainstem and cerebellum are normal. Cerebral hemispheres are normal except for a 6 mm focus of abnormal FLAIR and T2 signal within the corpus callosum just to the left of midline. No other white matter lesions are seen. No cortical abnormality. No mass lesion, hemorrhage, hydrocephalus or extra-axial collection. No pituitary mass. No inflammatory sinus disease. No skull or skullbase lesion. No orbital abnormality is seen.  Both globes appear grossly normal. IMPRESSION: The  only abnormality is a 6 mm focus of abnormal T2 and FLAIR signal within the corpus callosum. Even though this as an isolated finding, in a person of this age, it raises possibility of demyelinating disease/ multiple sclerosis. Electronically Signed   By: Nelson Chimes M.D.   On: 07/31/2015 20:59    Assessment and plan:   Ricardo [name] is an 30 y.o. male patient with left optic neuritis, receiving IV Solu-Medrol 1 g daily. Lumbar puncture ordered through IR, still pending to be done tomorrow. Tolerating Solu-Medrol without side effects. No significant change in the left eye vision loss.  No new neurological symptoms.  We'll follow-up.

## 2015-08-02 NOTE — Progress Notes (Signed)
PT Cancellation Note  Patient Details Name: Ricardo [name] MRN: [id number] DOB: 10-07-1985   Cancelled Treatment:    Reason Eval/Treat Not Completed: PT screened, no needs identified, will sign off Pt is independent with ambulation and mobility and does not require skilled PT services per chart review and discussion with OT. Will sign off for now. Thanks.   Ricardo [name] 08/02/2015, 9:11 AM Wray Kearns, Ashley, DPT [phone]

## 2015-08-02 NOTE — Evaluation (Signed)
Occupational Therapy Evaluation and Discharge Patient Details Name: Ricardo [name] MRN: [id number] DOB: 1985/04/28 Today's Date: 08/02/2015    History of Present Illness Ricardo [name] is a 30 y.o. male with medical history significant of narcolepsy, Crohn's disease, GERD, who presents with blurred vision. MRI: 6 mm focus of abnormal T2 and FLAIR signal within the corpus callosum possibly indicating  Demyelinating disease/ multiple sclerosis. Neurology favors  Patient most likely is experiencing optic neuritis involving his left optic nerve with retro-orbital involvemen. Pt fro lumbar puncture 08/03/2015.   Clinical Impression   This 30 yo male admitted with above presents to acute OT at an independent functional level despite some vision loss (mainly central) per pt. He could benefit from low vision therapy at OP if he feels his vision is getting worse or it starts interferring with his every day functioning. It is up to the MD as to whether he feels he should drive or not. Acute OT will sign off.    Follow Up Recommendations  Outpatient OT;Other (comment) (for low vision therapy at OP neuro rehab [address] in [place])    Equipment Recommendations  None recommended by OT       Precautions / Restrictions Precautions Precautions: None Restrictions Weight Bearing Restrictions: No      Mobility Bed Mobility Overal bed mobility: Independent                Transfers Overall transfer level: Independent               General transfer comment: Independent with ambulation without AD. Can look in all directions while ambulating without LOB. Up and down flight of steps with one hand on rail    Balance Overall balance assessment: Independent                                          ADL Overall ADL's : Independent                                             Vision Vision Assessment?: Yes Eye Alignment: Within Functional  Limits Ocular Range of Motion: Within Functional Limits Alignment/Gaze Preference: Within Defined Limits Tracking/Visual Pursuits: Able to track stimulus in all quads without difficulty Convergence: Within functional limits Visual Fields: No apparent deficits Additional Comments: When having pt look at pictures on 8 x 10 paper he does miss/has trouble seeing pictures/letters more centrally than perpherially when asked to look straight ahead and tell me what you see on the paper. He does have some issues with bluriness in perphery. Pt reports his vision has not affected him in being able to do his work, function at home, or affected his driving since he has his other eye and has his perpherial vision.           Pertinent Vitals/Pain Pain Assessment: No/denies pain     Hand Dominance Right   Extremity/Trunk Assessment Upper Extremity Assessment Upper Extremity Assessment: Overall WFL for tasks assessed (Pt reports intermittent numbness in hands--no functional deficits seen)           Communication Communication Communication: No difficulties   Cognition Arousal/Alertness: Awake/alert Behavior During Therapy: WFL for tasks assessed/performed Overall Cognitive Status: Within Functional Limits for tasks assessed  Home Living Family/patient expects to be discharged to:: Private residence Living Arrangements: Spouse/significant other Available Help at Discharge: Family Type of Home: House Home Access: Stairs to enter Technical brewer of Steps: 3   [place]: One level         Biochemist, clinical: [place]: None          Prior Functioning/Environment Level of Independence: Independent        Comments: Works as a Public librarian at [employer]    OT Diagnosis: Disturbance of vision   OT Problem List: Impaired vision/perception      OT Goals(Current goals can be found in the care  plan section) Acute Rehab OT Goals Patient Stated Goal: to figure out vision issues  OT Frequency:                End of [place] During Treatment:  (none) Nurse Communication:  (Nurse Gwyndolyn Saxon) gave the OK to leave alarm off and I agree due to pt is independent with mobility)  Activity Tolerance: Patient tolerated treatment well Patient left: in chair;with call bell/phone within reach   Time: 0817-0850 OT Time Calculation (min): 33 min Charges:  OT General Charges $OT Visit: 1 Procedure OT Evaluation $OT Eval Moderate Complexity: 1 Procedure OT Treatments $Therapeutic Activity: 8-22 mins  Almon Register [phone] 08/02/2015, 10:24 AM

## 2015-08-03 ENCOUNTER — Inpatient Hospital Stay (HOSPITAL_COMMUNITY): Payer: 59

## 2015-08-03 LAB — GLUCOSE, CAPILLARY: GLUCOSE-CAPILLARY: 248 mg/dL — AB (ref 65–99)

## 2015-08-03 MED ORDER — LORAZEPAM 2 MG/ML IJ SOLN
1.0000 mg | Freq: Once | INTRAMUSCULAR | Status: AC
  Administered 2015-08-03: 1 mg via INTRAVENOUS
  Filled 2015-08-03: qty 1

## 2015-08-03 MED ORDER — LORAZEPAM 2 MG/ML IJ SOLN
1.0000 mg | Freq: Once | INTRAMUSCULAR | Status: AC
  Administered 2015-08-03: 1 mg via INTRAVENOUS

## 2015-08-03 MED ORDER — GADOBENATE DIMEGLUMINE 529 MG/ML IV SOLN
20.0000 mL | Freq: Once | INTRAVENOUS | Status: AC | PRN
  Administered 2015-08-03: 20 mL via INTRAVENOUS

## 2015-08-03 MED ORDER — LORAZEPAM 2 MG/ML IJ SOLN
INTRAMUSCULAR | Status: AC
  Administered 2015-08-03: 1 mg via INTRAVENOUS
  Filled 2015-08-03: qty 1

## 2015-08-03 NOTE — Progress Notes (Signed)
when attempted to start IV, pt screams at cursed at me. Joni RN at bedside also. Pt refuses to have IV restarted. IV in left forearm flushes well .

## 2015-08-03 NOTE — Care Management Note (Signed)
Case Management Note  Patient Details  Name: Ricardo [name] MRN: [id number] Date of Birth: Feb 10, 1986  Subjective/Objective:   Pt admitted r/o MS. Pt on IV solumedrol. She is from home with her parents.                  Action/Plan: Rec is for outpatient therapy. CM following for d/c needs.   Expected Discharge Date:                  Expected Discharge Plan:     In-House Referral:     Discharge planning Services     Post Acute Care Choice:    Choice offered to:     DME Arranged:    DME Agency:     HH Arranged:    HH Agency:     Status of Service:  In process, will continue to follow  Medicare Important Message Given:    Date Medicare IM Given:    Medicare IM give by:    Date Additional Medicare IM Given:    Additional Medicare Important Message give by:     If discussed at [place] of Stay Meetings, dates discussed:    Additional Comments:  Pollie Friar, RN 08/03/2015, 12:03 PM

## 2015-08-03 NOTE — Progress Notes (Signed)
PROGRESS NOTE    Ricardo [name]  GYK:[id number] DOB: 1985-12-20 DOA: 07/31/2015 PCP: Helane Rima, MD  Outpatient Specialists:   Brief Narrative: 30 y.o. male with medical history significant of narcolepsy, Crohn's disease, GERD, who presents with 3 week history of blurry and tunneled vision. His blurry of vision worsens on 05/30/2015. He was seen in the emergency room and was referred to ophthalmologist.The patient was seen by the Ophthalmologist, Dr. Posey Pronto, who did not think patient's symptoms are due to eye problems. Some numbness of the extremity is also reported. The patient is on Remicade injection for Crohn's disease.    Assessment & Plan:   Active Problems:   Blurry vision, left eye   Crohn disease ([place])   Narcolepsy   GERD (gastroesophageal reflux disease)   Multiple sclerosis (HCC)   Optic neuritis  Assessment/Plan Active Problems:  Blurry vision, left eye  Crohn disease ([place])  Narcolepsy  GERD (gastroesophageal reflux disease)  - Blurry vision in left eye/lack of central vision/optic neuritis: LP is still pending. Patient had MRI of the orbit, cervical and thoracic spine. Still on high dose steroids.  - Hx of Crohn disease ([place]): On Remicade injection.   - Narcolepsy: continue home Armodafinil - GERD (gastroesophageal reflux disease):   DVT ppx: SCD (in case pt need LP) Code Status: Full code Family Communication: Yes, patient's wife and father at bed side Disposition Plan: Anticipate discharge back to previous home environment Consults called: Neurology, Dr. Nicole Kindred  Subjective: Nil new complaints  Objective: Filed Vitals:   08/02/15 2259 08/03/15 0213 08/03/15 0538 08/03/15 1029  BP: 134/73 126/66 127/67 153/75  Pulse: 79 68 65 61  Temp: 97.6 F (36.4 C) 98.5 F (36.9 C) 97.8 F (36.6 C) 97.7 F (36.5 C)  TempSrc: Oral Oral Oral Oral  Resp: 18 18 18    Height:      Weight:      SpO2: 98% 99% 99% 100%    Intake/Output Summary (Last 24  hours) at 08/03/15 1645 Last data filed at 08/03/15 0800  Gross per 24 hour  Intake    243 ml  Output      0 ml  Net    243 ml   Filed Weights   08/01/15 1716  Weight: 135.3 kg (298 lb 4.5 oz)    Examination:  General exam: Obese. Appears calm and comfortable  Respiratory system: Clear to auscultation. Respiratory effort normal. Cardiovascular system: S1 & S2. Gastrointestinal system: Abdomen is obese, soft and nontender.   Central nervous system: Alert and oriented. Moves all limbs. Extremities: No edema.   Data Reviewed: I have personally reviewed following labs and imaging studies  CBC:  Recent Labs Lab 07/31/15 2208 07/31/15 2237 08/01/15 0058  WBC 5.2  --  4.3  NEUTROABS 1.3*  --   --   HGB 12.9* 14.6 13.4  HCT 39.4 43.0 40.0  MCV 79.3  --  80.8  PLT 291  --  793   Basic Metabolic Panel:  Recent Labs Lab 07/31/15 2237 08/01/15 0058  NA 142 139  K 3.5 3.6  CL 102 105  CO2  --  25  GLUCOSE 76 127*  BUN 15 14  CREATININE 1.20 1.22  CALCIUM  --  9.0   GFR: Estimated Creatinine Clearance: 119.6 mL/min (by C-G formula based on Cr of 1.22). Liver Function Tests:  Recent Labs Lab 08/01/15 0058  AST 50*  ALT 46  ALKPHOS 46  BILITOT 0.6  PROT 7.4  ALBUMIN 3.6  No results for input(s): LIPASE, AMYLASE in the last 168 hours. No results for input(s): AMMONIA in the last 168 hours. Coagulation Profile: No results for input(s): INR, PROTIME in the last 168 hours. Cardiac Enzymes: No results for input(s): CKTOTAL, CKMB, CKMBINDEX, TROPONINI in the last 168 hours. BNP (last 3 results) No results for input(s): PROBNP in the last 8760 hours. HbA1C: No results for input(s): HGBA1C in the last 72 hours. CBG:  Recent Labs Lab 08/01/15 0947 08/03/15 0617  GLUCAP 128* 248*   Lipid Profile: No results for input(s): CHOL, HDL, LDLCALC, TRIG, CHOLHDL, LDLDIRECT in the last 72 hours. Thyroid Function Tests: No results for input(s): TSH, T4TOTAL,  FREET4, T3FREE, THYROIDAB in the last 72 hours. Anemia Panel: No results for input(s): VITAMINB12, FOLATE, FERRITIN, TIBC, IRON, RETICCTPCT in the last 72 hours. Urine analysis: No results found for: COLORURINE, APPEARANCEUR, LABSPEC, PHURINE, GLUCOSEU, HGBUR, BILIRUBINUR, KETONESUR, PROTEINUR, UROBILINOGEN, NITRITE, LEUKOCYTESUR Sepsis Labs: @LABRCNTIP (procalcitonin:4,lacticidven:4)  )No results found for this or any previous visit (from the past 240 hour(s)).       Radiology Studies: Mr Jeri Cos Contrast  08/03/2015  CLINICAL DATA:  ABNORMAL MRI HEAD. POSSIBLE DEMYELINATING DISEASE. POSSIBLE OPTIC NEURITIS. EXAM: MRI HEAD WITH CONTRAST AND ORBITS WITHOUT AND WITH CONTRAST TECHNIQUE: Multiplanar, multiecho pulse sequences of the brain and surrounding structures were obtained with intravenous contrast. Multiplanar, multiecho pulse sequences of the orbits and surrounding structures were obtained including fat saturation techniques, before and after intravenous contrast administration. CONTRAST:  89m MULTIHANCE GADOBENATE DIMEGLUMINE 529 MG/ML IV SOLN COMPARISON:  Unenhanced head CT 07/31/2015 FINDINGS: MRI HEAD FINDINGS Sagittal FLAIR imaging prior to contrast confirms a lesion in the left body the corpus callosum extending down to the ventricular surface. This area is typical for multiple sclerosis. Small hyperintensities in the periventricular white matter on the right may also be represent multiple scleroses. Postcontrast imaging demonstrates normal enhancement. No enhancing lesions identified. Normal vascular enhancement. No mass lesion. MRI ORBITS FINDINGS Normal globe. Extraocular muscles normal. Lacrimal gland normal bilaterally. Orbital fat is normal and symmetric. Optic nerve is normal in caliber and signal. No optic nerve edema or mass. Optic chiasm and optic tracts are normal. Postcontrast imaging demonstrates normal enhancement. Optic nerve enhances normally bilaterally. Paranasal sinuses  demonstrate mild mucosal edema. Image quality degraded by mild motion. IMPRESSION: Normal enhancement of the brain. Lesion in the left body the corpus callosum suggestive of multiple sclerosis. Small hyperintensities periventricular white matter on the right also suggestive of multiple sclerosis. Normal MRI of the orbit with contrast. Optic nerve appears normal bilaterally. Electronically Signed   By: CFranchot GalloM.D.   On: 08/03/2015 16:08   Mr Cervical Spine W Wo Contrast  08/03/2015  CLINICAL DATA:  Abnormal brain MRI. Possible multiple sclerosis. Blurred vision. EXAM: MRI CERVICAL SPINE WITHOUT AND WITH CONTRAST TECHNIQUE: Multiplanar and multiecho pulse sequences of the cervical spine, to include the craniocervical junction and cervicothoracic junction, were obtained according to standard protocol without and with intravenous contrast. CONTRAST:  22mMULTIHANCE GADOBENATE DIMEGLUMINE 529 MG/ML IV SOLN COMPARISON:  MRI head 07/31/2015 FINDINGS: Image quality degraded by large patient size and motion. Possible small lesion in the dorsal cord at C6-7 on T2 imaging. This is not confirmed on FLAIR imaging and this may be an artifact. This area does not show abnormal enhancement. Otherwise the cord is normal. Congenital stenosis of the cervical canal. Negative for disc protrusion. Mild disc degeneration and disc bulging at C4-5 with mild spinal stenosis. Postcontrast imaging demonstrates normal enhancement.  IMPRESSION: Possible small cord hyperintensity dorsally at C6-7. This is not confirmed on FLAIR imaging and could be an artifact. No other cord lesions. Congenital cervical spine stenosis. Mild disc bulging and mild spinal stenosis at C4-5. Electronically Signed   By: Franchot Gallo M.D.   On: 08/03/2015 15:44   Mr Thoracic Spine W Wo Contrast  08/03/2015  CLINICAL DATA:  Possible multiple sclerosis. Possible optic neuritis. Abnormal brain MRI. EXAM: MRI THORACIC SPINE WITHOUT AND WITH CONTRAST  TECHNIQUE: Multiplanar and multiecho pulse sequences of the thoracic spine were obtained without and with intravenous contrast. CONTRAST:  34m MULTIHANCE GADOBENATE DIMEGLUMINE 529 MG/ML IV SOLN COMPARISON:  None. FINDINGS: Image quality is limited by large patient size and mild motion. Allowing for motion, the cord is normal. No cord lesion identified. No cord compression. Postcontrast imaging demonstrates normal enhancement. No enhancing mass lesion. Spinal cord enhancement is normal. Mild thoracic degenerative changes are present. Small central disc protrusion T4-5 and T5-6 and T6-7. Small central disc protrusion at T7-8. No cord compression. IMPRESSION: Image quality degraded by large patient size and motion. Allowing for this, no cord lesion identified. Electronically Signed   By: CFranchot GalloM.D.   On: 08/03/2015 15:49   Mr ODarnelle CatalanWo/w Cm  08/03/2015  CLINICAL DATA:  ABNORMAL MRI HEAD. POSSIBLE DEMYELINATING DISEASE. POSSIBLE OPTIC NEURITIS. EXAM: MRI HEAD WITH CONTRAST AND ORBITS WITHOUT AND WITH CONTRAST TECHNIQUE: Multiplanar, multiecho pulse sequences of the brain and surrounding structures were obtained with intravenous contrast. Multiplanar, multiecho pulse sequences of the orbits and surrounding structures were obtained including fat saturation techniques, before and after intravenous contrast administration. CONTRAST:  237mMULTIHANCE GADOBENATE DIMEGLUMINE 529 MG/ML IV SOLN COMPARISON:  Unenhanced head CT 07/31/2015 FINDINGS: MRI HEAD FINDINGS Sagittal FLAIR imaging prior to contrast confirms a lesion in the left body the corpus callosum extending down to the ventricular surface. This area is typical for multiple sclerosis. Small hyperintensities in the periventricular white matter on the right may also be represent multiple scleroses. Postcontrast imaging demonstrates normal enhancement. No enhancing lesions identified. Normal vascular enhancement. No mass lesion. MRI ORBITS FINDINGS Normal  globe. Extraocular muscles normal. Lacrimal gland normal bilaterally. Orbital fat is normal and symmetric. Optic nerve is normal in caliber and signal. No optic nerve edema or mass. Optic chiasm and optic tracts are normal. Postcontrast imaging demonstrates normal enhancement. Optic nerve enhances normally bilaterally. Paranasal sinuses demonstrate mild mucosal edema. Image quality degraded by mild motion. IMPRESSION: Normal enhancement of the brain. Lesion in the left body the corpus callosum suggestive of multiple sclerosis. Small hyperintensities periventricular white matter on the right also suggestive of multiple sclerosis. Normal MRI of the orbit with contrast. Optic nerve appears normal bilaterally. Electronically Signed   By: ChFranchot Gallo.D.   On: 08/03/2015 16:08        Scheduled Meds: . loratadine  10 mg Oral Daily  . methylPREDNISolone (SOLU-MEDROL) injection  1,000 mg Intravenous Daily  . modafinil  200 mg Oral Q breakfast  . pantoprazole  40 mg Oral Q1200  . sodium chloride flush  3 mL Intravenous Q12H   Continuous Infusions: . sodium chloride 125 mL/hr (08/02/15 0304)     LOS: 2 days    Time spent: 25 Mins    SyBonnell PublicMD [facility] Pager [phone] 7PM-7AM, please contact night-coverage [email or web address] Password [email or web address]/15/2017, 4:45 PM

## 2015-08-04 ENCOUNTER — Inpatient Hospital Stay (HOSPITAL_COMMUNITY): Payer: 59

## 2015-08-04 LAB — CSF CELL COUNT WITH DIFFERENTIAL
Lymphs, CSF: 77 % (ref 40–80)
MONOCYTE-MACROPHAGE-SPINAL FLUID: 21 % (ref 15–45)
RBC Count, CSF: 32 /mm3 — ABNORMAL HIGH
Segmented Neutrophils-CSF: 2 % (ref 0–6)
Tube #: 3
WBC CSF: 8 /mm3 — AB (ref 0–5)

## 2015-08-04 LAB — GLUCOSE, CSF: GLUCOSE CSF: 122 mg/dL — AB (ref 40–70)

## 2015-08-04 LAB — GLUCOSE, CAPILLARY: Glucose-Capillary: 209 mg/dL — ABNORMAL HIGH (ref 65–99)

## 2015-08-04 LAB — PROTEIN, CSF: TOTAL PROTEIN, CSF: 48 mg/dL — AB (ref 15–45)

## 2015-08-04 MED ORDER — SODIUM CHLORIDE 0.9 % IV SOLN
1000.0000 mg | Freq: Once | INTRAVENOUS | Status: AC
  Administered 2015-08-04: 1000 mg via INTRAVENOUS
  Filled 2015-08-04: qty 8

## 2015-08-04 NOTE — Discharge Summary (Signed)
Physician Discharge Summary  Patient ID: Ricardo [name] MRN: [id number] DOB/AGE: 1985/05/25 30 y.o.  Admit date: 07/31/2015 Discharge date: 08/04/2015  Admission Diagnoses:  Discharge Diagnoses:  Active Problems:   Blurry vision, left eye   Crohn disease ([place])   Narcolepsy   GERD (gastroesophageal reflux disease)   Multiple sclerosis (HCC)   Optic neuritis   Discharged Condition: stable  Hospital Course: 30 y.o. male with past medical history significant of narcolepsy, Crohn's disease, GERD, who presents with 3 week history of blurry vision and loss of peripheral vision. His blurry of vision worsened on 05/30/2015. He was seen at the emergency room and was referred to ophthalmologist.The patient was seen by the Ophthalmologist, Dr. Posey Pronto, who did not think patient's symptoms were due to eye problems. Some numbness of the extremity is also reported. The patient is on Remicade injection for Crohn's disease.Further questioning suggested that patient's symptoms were likely consistent with optic neuritis, and concerns for possible early multiple sclerosis. Neurology was consulted to assist in directing patient's care. Patient was seen by the Neurologist and started on IV solumedrol 1026m daily for 5 days. Patient had MRI of the brain, left orbit, thoracic and cervical spine. Presumptive diagnosis for now is possible multiple sclerosis. Patient will undergo lumbar puncture prior to discharge home, and will be discharged back home if patient remains stable after lumbar puncture. Patient will need to follow up with the Neurologist (Multiple Sclerosis expert) on discharge, as well as the PCP.   Consults: Neurology  Significant Diagnostic Studies: MRI of the brain, MRI of the left orbit, MRI of the thoracic and cervical spine.  Discharge Medication - Please see the Med Rec. Treatments:    Discharge Exam: Blood pressure 150/72, pulse 73, temperature 98.1 F (36.7 C), temperature source Oral,  resp. rate 20, height 5' 7"  (1.702 m), weight 135.3 kg (298 lb 4.5 oz), SpO2 97 %.    Disposition: 01-Home or Self Care and Occupational therapy for eye symptoms.     Medication List    ASK your doctor about these medications        Armodafinil 150 MG tablet  Take 150 mg by mouth daily.     cetirizine 10 MG tablet  Commonly known as:  ZYRTEC  Take 10 mg by mouth every 8 (eight) weeks. With infusion     famotidine 20 MG tablet  Commonly known as:  PEPCID  Take 20 mg by mouth 2 (two) times daily.     inFLIXimab 100 MG injection  Commonly known as:  REMICADE  Inject into the vein every 8 (eight) weeks.     RA ACETAMINOPHEN 650 MG CR tablet  Generic drug:  acetaminophen  Take 650 mg by mouth every 8 (eight) weeks. With infusion         Signed: SBonnell Public5/16/2017, 2:47 PM

## 2015-08-04 NOTE — Care Management Note (Signed)
Case Management Note  Patient Details  Name: Ricardo [name] MRN: [id number] Date of Birth: 06-Jun-1985  Subjective/Objective:                    Action/Plan: Plan is for patient to discharge home with self care with outpatient OT. CM met with the patient and asked him about outpatient therapy at the [facility]. He was interested. Orders placed in EPIC and information on the AVS.   Expected Discharge Date:   (Pending)               Expected Discharge Plan:  Home/Self Care  In-House Referral:     Discharge planning Services  CM Consult  Post Acute Care Choice:    Choice offered to:     DME Arranged:    DME Agency:     HH Arranged:    [place] Agency:     Status of Service:  Completed, signed off  Medicare Important Message Given:    Date Medicare IM Given:    Medicare IM give by:    Date Additional Medicare IM Given:    Additional Medicare Important Message give by:     If discussed at [place] of Stay Meetings, dates discussed:    Additional Comments:  Pollie Friar, RN 08/04/2015, 3:27 PM

## 2015-08-04 NOTE — Progress Notes (Signed)
Interval History:                                                                                                                      Ricardo [name] is an 30 y.o. male patient with left optic neuritis, receiving IV Solu-Medrol 1 g daily with last dose today. Denies any significant change in his vision through the left eye. No side effects to Solu-Medrol. LP by  IR, pending. MRI of the brain showed a single tiny corpus callosum lesion.    Past Medical History: Past Medical History  Diagnosis Date  . Crohn disease ([place])   . Narcolepsy   . GERD (gastroesophageal reflux disease)     Past Surgical History  Procedure Laterality Date  . Tonsillectomy      Family History: Family History  Problem Relation Age of Onset  . Hyperlipidemia Mother   . Hypertension Mother   . Diabetes Mother   . Diabetes Father   . Hyperlipidemia Father   . Hypertension Father   . Diabetes Sister     Social History:   reports that he has never smoked. He does not have any smokeless tobacco history on file. He reports that he drinks alcohol. His drug history is not on file.  Allergies:  No Known Allergies   Medications:                                                                                                                         Current facility-administered medications:  .  0.9 %  sodium chloride infusion, , Intravenous, Continuous, Ivor Costa, MD, Last Rate: 125 mL/hr at 08/04/15 0534 .  loratadine (CLARITIN) tablet 10 mg, 10 mg, Oral, Daily, Ivor Costa, MD, 10 mg at 08/03/15 0956 .  methylPREDNISolone sodium succinate (SOLU-MEDROL) 1,000 mg in sodium chloride 0.9 % 50 mL IVPB, 1,000 mg, Intravenous, Daily, Tatyana Kirichenko, PA-C, 1,000 mg at 08/03/15 2345 .  modafinil (PROVIGIL) tablet 200 mg, 200 mg, Oral, Q breakfast, Ivor Costa, MD, 200 mg at 08/04/15 0847 .  ondansetron (ZOFRAN) tablet 4 mg, 4 mg, Oral, Q6H PRN **OR** ondansetron (ZOFRAN) injection 4 mg, 4 mg, Intravenous, Q6H PRN, Ivor Costa, MD .  pantoprazole (PROTONIX) EC tablet 40 mg, 40 mg, Oral, Q1200, Ivor Costa, MD, 40 mg at 08/03/15 1629 .  sodium chloride flush (NS) 0.9 % injection 3 mL, 3 mL, Intravenous, Q12H, Ivor Costa, MD, 3 mL at 08/03/15 2228   Neurologic Examination:  Today's Vitals   08/03/15 2131 08/04/15 0119 08/04/15 0533 08/04/15 0800  BP: 137/84 119/68 126/70   Pulse: 80 67 74   Temp: 98.6 F (37 C) 98.4 F (36.9 C) 98 F (36.7 C)   TempSrc: Oral Oral Oral   Resp: 18 18 18    Height:      Weight:      SpO2: 95% 100% 100%   PainSc:    0-No pain    Evaluation of higher integrative functions including: Level of alertness: Alert,  Oriented to time, place and person Speech: fluent, no evidence of dysarthria or aphasia noted.  Test the following cranial nerves: 2-12 grossly intact--left eye able to see red and green, able to count fingers but 20/200 on card Snellen chart.  Motor examination: Normal tone, bulk, full 5/5 motor strength in all 4 extremities Examination of sensation : Normal and symmetric sensation to pinprick in all 4 extremities and on face Examination of deep tendon reflexes: 2+, normal and symmetric in all extremities, normal plantars bilaterally Test coordination: Normal finger nose testing, with no evidence of limb appendicular ataxia or abnormal involuntary movements or tremors noted.  Gait: Deferred   Lab Results: Basic Metabolic Panel:  Recent Labs Lab 07/31/15 2237 08/01/15 0058  NA 142 139  K 3.5 3.6  CL 102 105  CO2  --  25  GLUCOSE 76 127*  BUN 15 14  CREATININE 1.20 1.22  CALCIUM  --  9.0    Liver Function Tests:  Recent Labs Lab 08/01/15 0058  AST 50*  ALT 46  ALKPHOS 46  BILITOT 0.6  PROT 7.4  ALBUMIN 3.6   No results for input(s): LIPASE, AMYLASE in the last 168 hours. No results for input(s): AMMONIA in the last 168 hours.  CBC:  Recent  Labs Lab 07/31/15 2208 07/31/15 2237 08/01/15 0058  WBC 5.2  --  4.3  NEUTROABS 1.3*  --   --   HGB 12.9* 14.6 13.4  HCT 39.4 43.0 40.0  MCV 79.3  --  80.8  PLT 291  --  303    Cardiac Enzymes: No results for input(s): CKTOTAL, CKMB, CKMBINDEX, TROPONINI in the last 168 hours.  Lipid Panel: No results for input(s): CHOL, TRIG, HDL, CHOLHDL, VLDL, LDLCALC in the last 168 hours.  CBG:  Recent Labs Lab 08/01/15 0947 08/03/15 0617 08/04/15 0629  GLUCAP 128* 248* 209*    Microbiology: No results found for this or any previous visit.  Imaging: Mr Herby Abraham Contrast  07/31/2015  CLINICAL DATA:  Acute presentation with blurred vision of the left eye beginning 3 weeks ago, worsening today. Mild pain. Headaches. EXAM: MRI HEAD WITHOUT CONTRAST TECHNIQUE: Multiplanar, multiecho pulse sequences of the brain and surrounding structures were obtained without intravenous contrast. COMPARISON:  None. FINDINGS: Diffusion imaging does not show any acute or subacute infarction. The brainstem and cerebellum are normal. Cerebral hemispheres are normal except for a 6 mm focus of abnormal FLAIR and T2 signal within the corpus callosum just to the left of midline. No other white matter lesions are seen. No cortical abnormality. No mass lesion, hemorrhage, hydrocephalus or extra-axial collection. No pituitary mass. No inflammatory sinus disease. No skull or skullbase lesion. No orbital abnormality is seen.  Both globes appear grossly normal. IMPRESSION: The only abnormality is a 6 mm focus of abnormal T2 and FLAIR signal within the corpus callosum. Even though this as an isolated finding, in a person of this age, it raises possibility of demyelinating disease/ multiple  sclerosis. Electronically Signed   By: Nelson Chimes M.D.   On: 07/31/2015 20:59   Mr Jeri Cos Contrast  08/03/2015  CLINICAL DATA:  ABNORMAL MRI HEAD. POSSIBLE DEMYELINATING DISEASE. POSSIBLE OPTIC NEURITIS. EXAM: MRI HEAD WITH CONTRAST AND  ORBITS WITHOUT AND WITH CONTRAST TECHNIQUE: Multiplanar, multiecho pulse sequences of the brain and surrounding structures were obtained with intravenous contrast. Multiplanar, multiecho pulse sequences of the orbits and surrounding structures were obtained including fat saturation techniques, before and after intravenous contrast administration. CONTRAST:  59m MULTIHANCE GADOBENATE DIMEGLUMINE 529 MG/ML IV SOLN COMPARISON:  Unenhanced head CT 07/31/2015 FINDINGS: MRI HEAD FINDINGS Sagittal FLAIR imaging prior to contrast confirms a lesion in the left body the corpus callosum extending down to the ventricular surface. This area is typical for multiple sclerosis. Small hyperintensities in the periventricular white matter on the right may also be represent multiple scleroses. Postcontrast imaging demonstrates normal enhancement. No enhancing lesions identified. Normal vascular enhancement. No mass lesion. MRI ORBITS FINDINGS Normal globe. Extraocular muscles normal. Lacrimal gland normal bilaterally. Orbital fat is normal and symmetric. Optic nerve is normal in caliber and signal. No optic nerve edema or mass. Optic chiasm and optic tracts are normal. Postcontrast imaging demonstrates normal enhancement. Optic nerve enhances normally bilaterally. Paranasal sinuses demonstrate mild mucosal edema. Image quality degraded by mild motion. IMPRESSION: Normal enhancement of the brain. Lesion in the left body the corpus callosum suggestive of multiple sclerosis. Small hyperintensities periventricular white matter on the right also suggestive of multiple sclerosis. Normal MRI of the orbit with contrast. Optic nerve appears normal bilaterally. Electronically Signed   By: CFranchot GalloM.D.   On: 08/03/2015 16:08   Mr Cervical Spine W Wo Contrast  08/03/2015  CLINICAL DATA:  Abnormal brain MRI. Possible multiple sclerosis. Blurred vision. EXAM: MRI CERVICAL SPINE WITHOUT AND WITH CONTRAST TECHNIQUE: Multiplanar and  multiecho pulse sequences of the cervical spine, to include the craniocervical junction and cervicothoracic junction, were obtained according to standard protocol without and with intravenous contrast. CONTRAST:  236mMULTIHANCE GADOBENATE DIMEGLUMINE 529 MG/ML IV SOLN COMPARISON:  MRI head 07/31/2015 FINDINGS: Image quality degraded by large patient size and motion. Possible small lesion in the dorsal cord at C6-7 on T2 imaging. This is not confirmed on FLAIR imaging and this may be an artifact. This area does not show abnormal enhancement. Otherwise the cord is normal. Congenital stenosis of the cervical canal. Negative for disc protrusion. Mild disc degeneration and disc bulging at C4-5 with mild spinal stenosis. Postcontrast imaging demonstrates normal enhancement. IMPRESSION: Possible small cord hyperintensity dorsally at C6-7. This is not confirmed on FLAIR imaging and could be an artifact. No other cord lesions. Congenital cervical spine stenosis. Mild disc bulging and mild spinal stenosis at C4-5. Electronically Signed   By: ChFranchot Gallo.D.   On: 08/03/2015 15:44   Mr Thoracic Spine W Wo Contrast  08/03/2015  CLINICAL DATA:  Possible multiple sclerosis. Possible optic neuritis. Abnormal brain MRI. EXAM: MRI THORACIC SPINE WITHOUT AND WITH CONTRAST TECHNIQUE: Multiplanar and multiecho pulse sequences of the thoracic spine were obtained without and with intravenous contrast. CONTRAST:  2066mULTIHANCE GADOBENATE DIMEGLUMINE 529 MG/ML IV SOLN COMPARISON:  None. FINDINGS: Image quality is limited by large patient size and mild motion. Allowing for motion, the cord is normal. No cord lesion identified. No cord compression. Postcontrast imaging demonstrates normal enhancement. No enhancing mass lesion. Spinal cord enhancement is normal. Mild thoracic degenerative changes are present. Small central disc protrusion T4-5 and T5-6 and T6-7.  Small central disc protrusion at T7-8. No cord compression. IMPRESSION:  Image quality degraded by large patient size and motion. Allowing for this, no cord lesion identified. Electronically Signed   By: Franchot Gallo M.D.   On: 08/03/2015 15:49   Mr Darnelle Catalan Wo/w Cm  08/03/2015  CLINICAL DATA:  ABNORMAL MRI HEAD. POSSIBLE DEMYELINATING DISEASE. POSSIBLE OPTIC NEURITIS. EXAM: MRI HEAD WITH CONTRAST AND ORBITS WITHOUT AND WITH CONTRAST TECHNIQUE: Multiplanar, multiecho pulse sequences of the brain and surrounding structures were obtained with intravenous contrast. Multiplanar, multiecho pulse sequences of the orbits and surrounding structures were obtained including fat saturation techniques, before and after intravenous contrast administration. CONTRAST:  2m MULTIHANCE GADOBENATE DIMEGLUMINE 529 MG/ML IV SOLN COMPARISON:  Unenhanced head CT 07/31/2015 FINDINGS: MRI HEAD FINDINGS Sagittal FLAIR imaging prior to contrast confirms a lesion in the left body the corpus callosum extending down to the ventricular surface. This area is typical for multiple sclerosis. Small hyperintensities in the periventricular white matter on the right may also be represent multiple scleroses. Postcontrast imaging demonstrates normal enhancement. No enhancing lesions identified. Normal vascular enhancement. No mass lesion. MRI ORBITS FINDINGS Normal globe. Extraocular muscles normal. Lacrimal gland normal bilaterally. Orbital fat is normal and symmetric. Optic nerve is normal in caliber and signal. No optic nerve edema or mass. Optic chiasm and optic tracts are normal. Postcontrast imaging demonstrates normal enhancement. Optic nerve enhances normally bilaterally. Paranasal sinuses demonstrate mild mucosal edema. Image quality degraded by mild motion. IMPRESSION: Normal enhancement of the brain. Lesion in the left body the corpus callosum suggestive of multiple sclerosis. Small hyperintensities periventricular white matter on the right also suggestive of multiple sclerosis. Normal MRI of the orbit with  contrast. Optic nerve appears normal bilaterally. Electronically Signed   By: CFranchot GalloM.D.   On: 08/03/2015 16:08    Assessment and plan:   Ricardo [name] an 30y.o. male patient with left optic neuritis, receiving IV Solu-Medrol 1 g daily. Lumbar puncture ordered through IR, still pending to be done tomorrow. Tolerating Solu-Medrol without side effects. No significant change in the left eye vision loss.  Will change last dose for early today so he may go home early if able. Will need out patient follow up with [facility] neuro specialist.

## 2015-08-04 NOTE — Procedures (Signed)
LP performed at L4-L5.  Opening pressure 15 cm H2O.  Slight blood-tinged CSF which cleared rapidly.  8 mL CSF obtained and sent to lab.  No complications.  See full dictation in PACS for further details.

## 2015-08-04 NOTE — Progress Notes (Signed)
Patient discharged home alert and oriented no pain, IV removed and discharge summary reviewed. Transported out of hospital by wheel chair by staff.

## 2015-08-06 LAB — CSF IGG: IGG CSF: 6.2 mg/dL (ref 0.0–8.6)

## 2015-08-07 LAB — OLIGOCLONAL BANDS, CSF + SERM

## 2015-08-24 DIAGNOSIS — H469 Unspecified optic neuritis: Secondary | ICD-10-CM | POA: Insufficient documentation

## 2015-12-22 ENCOUNTER — Emergency Department
Admission: EM | Admit: 2015-12-22 | Discharge: 2015-12-23 | Disposition: A | Discharge status: Home / Self Care | Payer: 59 | Attending: Emergency Medicine | Admitting: Emergency Medicine

## 2015-12-22 ENCOUNTER — Emergency Department: Payer: 59

## 2015-12-22 ENCOUNTER — Encounter: Payer: Self-pay | Admitting: *Deleted

## 2015-12-22 DIAGNOSIS — M2550 Pain in unspecified joint: Secondary | ICD-10-CM | POA: Insufficient documentation

## 2015-12-22 DIAGNOSIS — M791 Myalgia, unspecified site: Secondary | ICD-10-CM

## 2015-12-22 DIAGNOSIS — R0789 Other chest pain: Secondary | ICD-10-CM | POA: Diagnosis not present

## 2015-12-22 DIAGNOSIS — J45909 Unspecified asthma, uncomplicated: Secondary | ICD-10-CM | POA: Diagnosis not present

## 2015-12-22 DIAGNOSIS — Z791 Long term (current) use of non-steroidal anti-inflammatories (NSAID): Secondary | ICD-10-CM | POA: Diagnosis not present

## 2015-12-22 DIAGNOSIS — R079 Chest pain, unspecified: Secondary | ICD-10-CM

## 2015-12-22 DIAGNOSIS — R1084 Generalized abdominal pain: Secondary | ICD-10-CM | POA: Insufficient documentation

## 2015-12-22 HISTORY — DX: Unspecified asthma, uncomplicated: J45.909

## 2015-12-22 LAB — CBC
HCT: 36.3 % — ABNORMAL LOW (ref 40.0–52.0)
Hemoglobin: 12.1 g/dL — ABNORMAL LOW (ref 13.0–18.0)
MCH: 25 pg — AB (ref 26.0–34.0)
MCHC: 33.2 g/dL (ref 32.0–36.0)
MCV: 75.1 fL — AB (ref 80.0–100.0)
PLATELETS: 503 10*3/uL — AB (ref 150–440)
RBC: 4.83 MIL/uL (ref 4.40–5.90)
RDW: 12.4 % (ref 11.5–14.5)
WBC: 6.4 10*3/uL (ref 3.8–10.6)

## 2015-12-22 LAB — COMPREHENSIVE METABOLIC PANEL
ALK PHOS: 65 U/L (ref 38–126)
ALT: 26 U/L (ref 17–63)
AST: 26 U/L (ref 15–41)
Albumin: 3.4 g/dL — ABNORMAL LOW (ref 3.5–5.0)
Anion gap: 6 (ref 5–15)
BILIRUBIN TOTAL: 0.1 mg/dL — AB (ref 0.3–1.2)
BUN: 15 mg/dL (ref 6–20)
CALCIUM: 9.2 mg/dL (ref 8.9–10.3)
CHLORIDE: 105 mmol/L (ref 101–111)
CO2: 28 mmol/L (ref 22–32)
CREATININE: 1.18 mg/dL (ref 0.61–1.24)
Glucose, Bld: 88 mg/dL (ref 65–99)
Potassium: 4.2 mmol/L (ref 3.5–5.1)
Sodium: 139 mmol/L (ref 135–145)
Total Protein: 8.4 g/dL — ABNORMAL HIGH (ref 6.5–8.1)

## 2015-12-22 LAB — URINALYSIS COMPLETE WITH MICROSCOPIC (ARMC ONLY)
BILIRUBIN URINE: NEGATIVE
Bacteria, UA: NONE SEEN
Glucose, UA: NEGATIVE mg/dL
Hgb urine dipstick: NEGATIVE
KETONES UR: NEGATIVE mg/dL
Leukocytes, UA: NEGATIVE
Nitrite: NEGATIVE
PROTEIN: 30 mg/dL — AB
Specific Gravity, Urine: 1.027 (ref 1.005–1.030)
pH: 5 (ref 5.0–8.0)

## 2015-12-22 LAB — LIPASE, BLOOD: Lipase: 51 U/L (ref 11–51)

## 2015-12-22 MED ORDER — KETOROLAC TROMETHAMINE 60 MG/2ML IM SOLN
60.0000 mg | Freq: Once | INTRAMUSCULAR | Status: AC
  Administered 2015-12-23: 60 mg via INTRAMUSCULAR
  Filled 2015-12-22: qty 2

## 2015-12-22 NOTE — ED Triage Notes (Signed)
Pt reports abd pain.  Hx chron's dz.  Pt recently switched meds and has not been doing as well with new meds.   Pt has vomiting and diarrhea.  Pt alert.

## 2015-12-22 NOTE — ED Provider Notes (Signed)
[facility] Emergency Department Provider Note   ____________________________________________   First MD Initiated Contact with Patient 12/22/15 2308     (approximate)  I have reviewed the triage vital signs and the nursing notes.   HISTORY  Chief Complaint Abdominal Pain    HPI Ricardo [name] is a 30 y.o. male who comes into the hospital today with body pains. He reports that he is having pain in his back, shoulders, hips as well as his chest. He also has some abdominal pains. He thinks this may be due to his Crohn's flares but reports that the pain has been getting worse so he decided to come in and get checked out.The patient reports that he has been talking to his doctor and nurse's. He had been on Remicade for his Crohn's and was doing well but he started losing vision in his eyes which was thought to be due to his Remicade. It was discontinued and the patient was started on a new medication. He reports that there is 3 introductory doses to get the medication started and the patient has only taken 2. He is due for his third dose on Thursday but reports that the body aches were getting worse. He reports that he's had body aches as well as abdominal pains nausea and vomiting for multiple months. He came in tonight to see if there was something else causing the symptoms. He's had no fever and no shortness of breath but reports this week he started having some chest pains. He reports that the pain seemed to be worse when he takes a deep breath in. He reports it feels tight with pressure. The patient rates his pain a 4-5 out of 10 in intensity. He is here for evaluation.   Past Medical History:  Diagnosis Date  . Asthma   . Crohn disease ([place])   . GERD (gastroesophageal reflux disease)   . Narcolepsy     Patient Active Problem List   Diagnosis Date Noted  . Optic neuritis, left   . Multiple sclerosis ([place])   . Optic neuritis   . Blurry vision, left eye  07/31/2015  . Crohn disease ([place])   . Narcolepsy   . GERD (gastroesophageal reflux disease)   . Vision changes     Past Surgical History:  Procedure Laterality Date  . TONSILLECTOMY      Prior to Admission medications   Medication Sig Start Date End Date Taking? Authorizing Provider  acetaminophen (RA ACETAMINOPHEN) 650 MG CR tablet Take 650 mg by mouth every 8 (eight) weeks. With infusion    Historical Provider, MD  Armodafinil 150 MG tablet Take 150 mg by mouth daily.    Historical Provider, MD  cetirizine (ZYRTEC) 10 MG tablet Take 10 mg by mouth every 8 (eight) weeks. With infusion    Historical Provider, MD  famotidine (PEPCID) 20 MG tablet Take 20 mg by mouth 2 (two) times daily.    Historical Provider, MD  inFLIXimab (REMICADE) 100 MG injection Inject into the vein every 8 (eight) weeks.    Historical Provider, MD  traMADol (ULTRAM) 50 MG tablet Take 1 tablet (50 mg total) by mouth every 6 (six) hours as needed. 12/23/15   Loney Hering, MD    Allergies Review of patient's allergies indicates no known allergies.  Family History  Problem Relation Age of Onset  . Hyperlipidemia Mother   . Hypertension Mother   . Diabetes Mother   . Diabetes Father   . Hyperlipidemia  Father   . Hypertension Father   . Diabetes Sister     Social History Social History  Substance Use Topics  . Smoking status: Never Smoker  . Smokeless tobacco: Never Used  . Alcohol use No    Review of Systems Constitutional: No fever/chills Eyes: No visual changes. ENT: No sore throat. Cardiovascular:  chest pain. Respiratory: Denies shortness of breath. Gastrointestinal: abdominal pain.   nausea, vomiting.  No diarrhea.  No constipation. Genitourinary: Negative for dysuria. Musculoskeletal:  back pain, shoulder pain, hip pain Skin: Negative for rash. Neurological: Negative for headaches, focal weakness or numbness.  10-point ROS otherwise  negative.  ____________________________________________   PHYSICAL EXAM:  VITAL SIGNS: ED Triage Vitals  Enc Vitals Group     BP 12/22/15 2030 (!) 148/98     Pulse Rate 12/22/15 2028 79     Resp 12/22/15 2028 20     Temp 12/22/15 2028 99.4 F (37.4 C)     Temp Source 12/22/15 2028 Oral     SpO2 12/22/15 2028 99 %     Weight 12/22/15 2029 283 lb (128.4 kg)     Height 12/22/15 2029 5' 7"  (1.702 m)     Head Circumference --      Peak Flow --      Pain Score 12/22/15 2029 4     Pain Loc --      Pain Edu? --      Excl. in [place]? --     Constitutional: Alert and oriented. Well appearing and in mild distress. Eyes: Conjunctivae are normal. PERRL. EOMI. Head: Atraumatic. Nose: No congestion/rhinnorhea. Mouth/Throat: Mucous membranes are moist.  Oropharynx non-erythematous. Cardiovascular: Normal rate, regular rhythm. Grossly normal heart sounds.  Good peripheral circulation. Respiratory: Normal respiratory effort.  No retractions. Lungs CTAB. Gastrointestinal: Soft mild diffuse tenderness. No distention. Positive bowel sounds Musculoskeletal: No lower extremity tenderness nor edema.   Neurologic:  Normal speech and language.  Skin:  Skin is warm, dry and intact.  Psychiatric: Mood and affect are normal.   ____________________________________________   LABS (all labs ordered are listed, but only abnormal results are displayed)  Labs Reviewed  COMPREHENSIVE METABOLIC PANEL - Abnormal; Notable for the following:       Result Value   Total Protein 8.4 (*)    Albumin 3.4 (*)    Total Bilirubin 0.1 (*)    All other components within normal limits  CBC - Abnormal; Notable for the following:    Hemoglobin 12.1 (*)    HCT 36.3 (*)    MCV 75.1 (*)    MCH 25.0 (*)    Platelets 503 (*)    All other components within normal limits  URINALYSIS COMPLETEWITH MICROSCOPIC ([facility] ONLY) - Abnormal; Notable for the following:    Color, Urine YELLOW (*)    APPearance CLEAR (*)     Protein, ur 30 (*)    Squamous Epithelial / LPF 0-5 (*)    All other components within normal limits  FIBRIN DERIVATIVES D-DIMER ([facility] ONLY) - Abnormal; Notable for the following:    Fibrin derivatives D-dimer ([facility]) 564 (*)    All other components within normal limits  LIPASE, BLOOD  TROPONIN I   ____________________________________________  EKG  ED ECG REPORT I, Loney Hering, the attending physician, personally viewed and interpreted this ECG.   Date: 12/22/2015  EKG Time: 2354  Rate: 85  Rhythm: normal sinus rhythm  Axis: normal  Intervals:none  ST&T Change: T-wave flattening in inferior and lateral  leads  ____________________________________________  RADIOLOGY  CXR CTA chest ____________________________________________   PROCEDURES  Procedure(s) performed: None  Procedures  Critical Care performed: No  ____________________________________________   INITIAL IMPRESSION / ASSESSMENT AND PLAN / ED COURSE  Pertinent labs & imaging results that were available during my care of the patient were reviewed by me and considered in my medical decision making (see chart for details).  This is a 30 year old male who comes into the hospital today with some chest pain and abdominal pain as well as body aches. The patient has been experiencing some of these symptoms for months but the chest pain has been more recent. The patient also reports that his symptoms have been getting worse. Given the pleuritic nature of his chest pain I will check a d-dimer and I will check some blood work. I will give the patient a dose of Toradol and I will reassess the patient. He will receive some blood work looking for ACS and the d-dimer will be evaluating for possible pulmonary embolism. The patient may also have arthralgias due to the autoimmune nature of his disease. He will be reassessed.  Clinical Course  Value Comment By Time  DG Chest 2 View Normal chest Loney Hering, MD  10/04 [phone]  CT Angio Chest PE W and/or Wo Contrast No pulmonary embolism nor acute cardiopulmonary process. Loney Hering, MD 10/04 0302   The chest x-ray and the CT are all unremarkable. I did then look of the side effect profile of his new medication and it was found that joint pains are on that side effect profile. I explained this to the patient and informed him that he needs to follow-up with his doctor to determine what his best options are. The patient understands and agrees. I will give the patient a dose of prednisone here and a prescription for tramadol. He will be discharged to home.  ____________________________________________   FINAL CLINICAL IMPRESSION(S) / ED DIAGNOSES  Final diagnoses:  Arthralgia, unspecified joint  Myalgia  Generalized abdominal pain  Chest pain, unspecified type      NEW MEDICATIONS STARTED DURING THIS VISIT:  New Prescriptions   TRAMADOL (ULTRAM) 50 MG TABLET    Take 1 tablet (50 mg total) by mouth every 6 (six) hours as needed.     Note:  This document was prepared using Dragon voice recognition software and may include unintentional dictation errors.    Loney Hering, MD 12/23/15 [phone]

## 2015-12-23 ENCOUNTER — Emergency Department: Payer: 59

## 2015-12-23 LAB — FIBRIN DERIVATIVES D-DIMER (ARMC ONLY): Fibrin derivatives D-dimer (ARMC): 564 — ABNORMAL HIGH (ref 0–499)

## 2015-12-23 LAB — TROPONIN I: Troponin I: <0.03 ng/mL (ref <0.03)

## 2015-12-23 MED ORDER — IOPAMIDOL (ISOVUE-370) INJECTION 76%
75.0000 mL | Freq: Once | INTRAVENOUS | Status: AC | PRN
  Administered 2015-12-23: 75 mL via INTRAVENOUS

## 2015-12-23 MED ORDER — PREDNISONE 20 MG PO TABS
60.0000 mg | ORAL_TABLET | Freq: Once | ORAL | Status: AC
  Administered 2015-12-23: 60 mg via ORAL
  Filled 2015-12-23: qty 3

## 2015-12-23 MED ORDER — TRAMADOL HCL 50 MG PO TABS: 50.0000 mg | ORAL_TABLET | Freq: Four times a day (QID) | ORAL | 0 refills | Status: DC | PRN

## 2016-01-13 ENCOUNTER — Emergency Department
Admission: EM | Admit: 2016-01-13 | Discharge: 2016-01-13 | Disposition: A | Discharge status: Home / Self Care | Payer: 59 | Attending: Emergency Medicine | Admitting: Emergency Medicine

## 2016-01-13 DIAGNOSIS — Z79899 Other long term (current) drug therapy: Secondary | ICD-10-CM | POA: Insufficient documentation

## 2016-01-13 DIAGNOSIS — J45909 Unspecified asthma, uncomplicated: Secondary | ICD-10-CM | POA: Insufficient documentation

## 2016-01-13 DIAGNOSIS — M62838 Other muscle spasm: Secondary | ICD-10-CM

## 2016-01-13 MED ORDER — DIAZEPAM 5 MG PO TABS
5.0000 mg | ORAL_TABLET | Freq: Once | ORAL | Status: AC
  Administered 2016-01-13: 5 mg via ORAL
  Filled 2016-01-13: qty 1

## 2016-01-13 MED ORDER — OXYCODONE-ACETAMINOPHEN 5-325 MG PO TABS: 1.0000 | ORAL_TABLET | ORAL | 0 refills | Status: DC | PRN

## 2016-01-13 NOTE — ED Provider Notes (Signed)
[facility] Emergency Department Provider Note   First MD Initiated Contact with Patient 01/13/16 [phone]     (approximate)  I have reviewed the triage vital signs and the nursing notes.   HISTORY  Chief Complaint Torticollis   HPI Ricardo [name] is a 30 y.o. male with history of Crohn's disease presents to the emergency department with intermittent right neck spasms occurring approximately every 30 seconds. Pain is worsened with movement. Patient states that his pain during the spasms are 10 out of 10 in intensity. Patient states that he has generalized body discomfort which he thinks is related to a Crohn's flare. Patient states that his generalized body pain is consistent with previous Crohn's flare however the pain in his neck is inconsistent with previous Crohn's flares. Patient denies any fever no difficulty breathing no cough.   Past Medical History:  Diagnosis Date  . Asthma   . Crohn disease ([place])   . GERD (gastroesophageal reflux disease)   . Narcolepsy     Patient Active Problem List   Diagnosis Date Noted  . Optic neuritis, left   . Multiple sclerosis ([place])   . Optic neuritis   . Blurry vision, left eye 07/31/2015  . Crohn disease ([place])   . Narcolepsy   . GERD (gastroesophageal reflux disease)   . Vision changes     Past Surgical History:  Procedure Laterality Date  . TONSILLECTOMY      Prior to Admission medications   Medication Sig Start Date End Date Taking? Authorizing Provider  acetaminophen (RA ACETAMINOPHEN) 650 MG CR tablet Take 650 mg by mouth every 8 (eight) weeks. With infusion    Historical Provider, MD  Armodafinil 150 MG tablet Take 150 mg by mouth daily.    Historical Provider, MD  cetirizine (ZYRTEC) 10 MG tablet Take 10 mg by mouth every 8 (eight) weeks. With infusion    Historical Provider, MD  famotidine (PEPCID) 20 MG tablet Take 20 mg by mouth 2 (two) times daily.    Historical Provider, MD  inFLIXimab  (REMICADE) 100 MG injection Inject into the vein every 8 (eight) weeks.    Historical Provider, MD  traMADol (ULTRAM) 50 MG tablet Take 1 tablet (50 mg total) by mouth every 6 (six) hours as needed. 12/23/15   Loney Hering, MD    Allergies No known drug allergies  Family History  Problem Relation Age of Onset  . Hyperlipidemia Mother   . Hypertension Mother   . Diabetes Mother   . Diabetes Father   . Hyperlipidemia Father   . Hypertension Father   . Diabetes Sister     Social History Social History  Substance Use Topics  . Smoking status: Never Smoker  . Smokeless tobacco: Never Used  . Alcohol use No    Review of Systems Constitutional: No fever/chills Eyes: No visual changes. ENT: No sore throat. Cardiovascular: Denies chest pain. Respiratory: Denies shortness of breath. Gastrointestinal: No abdominal pain.  No nausea, no vomiting.  No diarrhea.  No constipation. Genitourinary: Negative for dysuria. Musculoskeletal: Negative for back pain. Positive for right sided neck pain Skin: Negative for rash. Neurological: Negative for headaches, focal weakness or numbness.  10-point ROS otherwise negative.  ____________________________________________   PHYSICAL EXAM:  VITAL SIGNS: ED Triage Vitals  Enc Vitals Group     BP 01/13/16 0440 (!) 167/102     Pulse Rate 01/13/16 0439 82     Resp 01/13/16 0439 18     Temp 01/13/16  0439 98.1 F (36.7 C)     Temp Source 01/13/16 0439 Oral     SpO2 01/13/16 0439 100 %     Weight 01/13/16 0439 285 lb (129.3 kg)     Height 01/13/16 0439 5' 8"  (1.727 m)     Head Circumference --      Peak Flow --      Pain Score 01/13/16 0439 10     Pain Loc --      Pain Edu? --      Excl. in [place]? --     Constitutional: Alert and oriented. Well appearing and in no acute distress. Eyes: Conjunctivae are normal. PERRL. EOMI. Head: Atraumatic. Mouth/Throat: Mucous membranes are moist.  Oropharynx non-erythematous. Neck: No stridor.  No  cervical spine tenderness to palpation. Pain with active and passive range of motion of the neck to the right Cardiovascular: Normal rate, regular rhythm. Good peripheral circulation. Grossly normal heart sounds. Respiratory: Normal respiratory effort.  No retractions. Lungs CTAB. Gastrointestinal: Soft and nontender. No distention.  Musculoskeletal: No lower extremity tenderness nor edema. No gross deformities of extremities. Neurologic:  Normal speech and language. No gross focal neurologic deficits are appreciated.  Skin:  Skin is warm, dry and intact. No rash noted. Psychiatric: Mood and affect are normal. Speech and behavior are normal.     Procedures     INITIAL IMPRESSION / ASSESSMENT AND PLAN / ED COURSE  Pertinent labs & imaging results that were available during my care of the patient were reviewed by me and considered in my medical decision making (see chart for details).  History of physical exam consistent with muscle spasms of the neck. Patient given Valium 5 mg tablet 2 doses with improvement of pain   Clinical Course    ____________________________________________  FINAL CLINICAL IMPRESSION(S) / ED DIAGNOSES  Final diagnoses:  Muscle spasm     MEDICATIONS GIVEN DURING THIS VISIT:  Medications  diazepam (VALIUM) tablet 5 mg (5 mg Oral Given 01/13/16 0518)     NEW OUTPATIENT MEDICATIONS STARTED DURING THIS VISIT:  New Prescriptions   No medications on file    Modified Medications   No medications on file    Discontinued Medications   No medications on file     Note:  This document was prepared using Dragon voice recognition software and may include unintentional dictation errors.    Gregor Hams, MD 01/13/16 [phone]

## 2016-01-13 NOTE — ED Notes (Signed)
Checked in with patient. Pt reports that if he doesn't move he is not having any pain, but just prior to this RN going in the room, he moved a certain way that caused the muscle in the right side of his neck to spasm. Pt sitting on stretcher with even, unlabored respirations. Denies any needs at this time. Will continue to monitor.

## 2016-01-13 NOTE — ED Triage Notes (Signed)
Pt in with co right sided neck pain for over a week denies any injury. Pt states he also has generalized body pain that he think could be related to his crohn's.

## 2016-01-13 NOTE — ED Notes (Signed)
Pt reports intermittent right neck spasms, states that they occur every 30 seconds; started approximately 0350. Thinks they may be related to medications for his crohn's

## 2019-02-11 NOTE — Progress Notes (Signed)
 [facility] Video Visit   Patient ID:  Ricardo [name] is a 33 y.o. (DOB 03/12/1986) male  Patient has been advised as to the limitations and limited nature of physical exam due to nature of a video visit, the possibility of privacy risk in the use of a video visit, and that the healthcare provider may recommend visiting a healthcare clinic for in-person care and follow up.  Video Visit Assessment and Plan   1. Viral illness (Primary)   Patient with no symptoms, and with a negative Covid test last week.  He would like to go back to work. He will send me paperwork to fill out for his company. He also needs to send me the Covid test results.    Patient's Medications       Accurate as of February 11, 2019  1:24 PM. Reflects encounter med changes as of last refresh        Continued Medications     Instructions  famotidine  20 MG tablet Commonly known as: PEPCID   20 mg, Oral, 2 times a day   ustekinumab 90 MG/ML Sosy syringe Commonly known as: STELARA  90 mg, Subcutaneous, every 6 weeks        Risk, benefits, and alternatives were provided through patient instructions given to the patient electronically and during the video interaction.  If any worsening symptoms or lack of improvement, the patient will seek immediate medical care.  Video Visit History   Patient reports over the weekend any 14th and 15 November he lost his sense of taste and smell, he was worried that that could be something related to Covid, so Monday, November 16 he had a Covid test taken, the results came back November 18 and were negative.  He reports his taste and smell came back quickly, he is not feeling ill in any way, no fever, no cough, no congestion, no nausea or GI upset, he is eating and drinking normally, sleeping normally. He has a form that needs to be filled out so that he can go back to work.  He would like to go back to work tomorrow.  Reviewed and updated this visit by provider: Tobacco   Allergies  Meds  Problems  Med Hx  Surg Hx  Fam Hx        ROS:  As documented in the history above, all other relevant system complaints were negative.  Video Visit Objective Findings   Examination conducted with the use of video cameras/computer monitors. Vital signs and other aspects of physical exam are limited due to the nature of this encounter.   Constitutional:  No apparent acute distress noted during the video interaction; Alert and oriented with normal mentation and verbally interactive. Mood:  Appears appropriate to situation.

## 2019-05-17 ENCOUNTER — Emergency Department
Admission: EM | Admit: 2019-05-17 | Discharge: 2019-05-17 | Disposition: A | Discharge status: Home / Self Care | Payer: No Typology Code available for payment source | Attending: Emergency Medicine | Admitting: Emergency Medicine

## 2019-05-17 ENCOUNTER — Other Ambulatory Visit: Payer: Self-pay

## 2019-05-17 ENCOUNTER — Encounter: Payer: Self-pay | Admitting: Emergency Medicine

## 2019-05-17 DIAGNOSIS — Y9389 Activity, other specified: Secondary | ICD-10-CM | POA: Insufficient documentation

## 2019-05-17 DIAGNOSIS — M545 Low back pain: Secondary | ICD-10-CM | POA: Diagnosis not present

## 2019-05-17 DIAGNOSIS — J45909 Unspecified asthma, uncomplicated: Secondary | ICD-10-CM | POA: Insufficient documentation

## 2019-05-17 DIAGNOSIS — Y92410 Unspecified street and highway as the place of occurrence of the external cause: Secondary | ICD-10-CM | POA: Insufficient documentation

## 2019-05-17 DIAGNOSIS — M25512 Pain in left shoulder: Secondary | ICD-10-CM | POA: Diagnosis not present

## 2019-05-17 DIAGNOSIS — Y998 Other external cause status: Secondary | ICD-10-CM | POA: Insufficient documentation

## 2019-05-17 DIAGNOSIS — M549 Dorsalgia, unspecified: Secondary | ICD-10-CM | POA: Insufficient documentation

## 2019-05-17 DIAGNOSIS — Z79899 Other long term (current) drug therapy: Secondary | ICD-10-CM | POA: Insufficient documentation

## 2019-05-17 MED ORDER — MELOXICAM 15 MG PO TABS: 15.0000 mg | ORAL_TABLET | Freq: Every day | ORAL | 1 refills | Status: AC

## 2019-05-17 MED ORDER — METHOCARBAMOL 500 MG PO TABS: 500.0000 mg | ORAL_TABLET | Freq: Three times a day (TID) | ORAL | 0 refills | Status: DC | PRN

## 2019-05-17 MED ORDER — METHOCARBAMOL 500 MG PO TABS: 500.0000 mg | ORAL_TABLET | Freq: Three times a day (TID) | ORAL | 0 refills | Status: AC | PRN

## 2019-05-17 MED ORDER — MELOXICAM 15 MG PO TABS: 15.0000 mg | ORAL_TABLET | Freq: Every day | ORAL | 1 refills | Status: DC
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
## 2019-09-05 DIAGNOSIS — D500 Iron deficiency anemia secondary to blood loss (chronic): Secondary | ICD-10-CM | POA: Insufficient documentation

## 2019-10-04 ENCOUNTER — Observation Stay
Admission: EM | Admit: 2019-10-04 | Discharge: 2019-10-05 | DRG: 059 | Disposition: A | Discharge status: Home / Self Care | Payer: Managed Care, Other (non HMO) | Attending: Internal Medicine | Admitting: Internal Medicine

## 2019-10-04 ENCOUNTER — Other Ambulatory Visit: Payer: Self-pay

## 2019-10-04 ENCOUNTER — Emergency Department: Payer: Managed Care, Other (non HMO)

## 2019-10-04 DIAGNOSIS — I10 Essential (primary) hypertension: Secondary | ICD-10-CM | POA: Diagnosis not present

## 2019-10-04 DIAGNOSIS — Z20822 Contact with and (suspected) exposure to covid-19: Secondary | ICD-10-CM | POA: Diagnosis not present

## 2019-10-04 DIAGNOSIS — G47419 Narcolepsy without cataplexy: Secondary | ICD-10-CM | POA: Diagnosis not present

## 2019-10-04 DIAGNOSIS — Z6841 Body Mass Index (BMI) 40.0 and over, adult: Secondary | ICD-10-CM

## 2019-10-04 DIAGNOSIS — R200 Anesthesia of skin: Secondary | ICD-10-CM | POA: Diagnosis present

## 2019-10-04 DIAGNOSIS — G35 Multiple sclerosis: Secondary | ICD-10-CM | POA: Diagnosis not present

## 2019-10-04 DIAGNOSIS — Z8249 Family history of ischemic heart disease and other diseases of the circulatory system: Secondary | ICD-10-CM

## 2019-10-04 DIAGNOSIS — K219 Gastro-esophageal reflux disease without esophagitis: Secondary | ICD-10-CM | POA: Diagnosis present

## 2019-10-04 DIAGNOSIS — Z83438 Family history of other disorder of lipoprotein metabolism and other lipidemia: Secondary | ICD-10-CM | POA: Diagnosis not present

## 2019-10-04 DIAGNOSIS — J45909 Unspecified asthma, uncomplicated: Secondary | ICD-10-CM | POA: Diagnosis not present

## 2019-10-04 DIAGNOSIS — K5090 Crohn's disease, unspecified, without complications: Secondary | ICD-10-CM | POA: Diagnosis present

## 2019-10-04 DIAGNOSIS — Z833 Family history of diabetes mellitus: Secondary | ICD-10-CM

## 2019-10-04 DIAGNOSIS — M6281 Muscle weakness (generalized): Secondary | ICD-10-CM | POA: Diagnosis not present

## 2019-10-04 DIAGNOSIS — K5000 Crohn's disease of small intestine without complications: Secondary | ICD-10-CM

## 2019-10-04 LAB — BASIC METABOLIC PANEL WITH GFR
Anion gap: 7 (ref 5–15)
BUN: 13 mg/dL (ref 6–20)
CO2: 26 mmol/L (ref 22–32)
Calcium: 9 mg/dL (ref 8.9–10.3)
Chloride: 104 mmol/L (ref 98–111)
Creatinine, Ser: 1.29 mg/dL — ABNORMAL HIGH (ref 0.61–1.24)
GFR calc Af Amer: >60 mL/min
GFR calc non Af Amer: >60 mL/min
Glucose, Bld: 86 mg/dL (ref 70–99)
Potassium: 4.3 mmol/L (ref 3.5–5.1)
Sodium: 137 mmol/L (ref 135–145)

## 2019-10-04 LAB — CBC
HCT: 38.8 % — ABNORMAL LOW (ref 39.0–52.0)
Hemoglobin: 12.8 g/dL — ABNORMAL LOW (ref 13.0–17.0)
MCH: 24.9 pg — ABNORMAL LOW (ref 26.0–34.0)
MCHC: 33 g/dL (ref 30.0–36.0)
MCV: 75.5 fL — ABNORMAL LOW (ref 80.0–100.0)
Platelets: 380 K/uL (ref 150–400)
RBC: 5.14 MIL/uL (ref 4.22–5.81)
RDW: 14.2 % (ref 11.5–15.5)
WBC: 5.6 K/uL (ref 4.0–10.5)
nRBC: 0 % (ref 0.0–0.2)

## 2019-10-04 LAB — SARS CORONAVIRUS 2 BY RT PCR (HOSPITAL ORDER, PERFORMED IN ~~LOC~~ HOSPITAL LAB): SARS Coronavirus 2: NEGATIVE

## 2019-10-04 MED ORDER — LORATADINE 10 MG PO TABS
10.0000 mg | ORAL_TABLET | Freq: Every day | ORAL | Status: DC
  Administered 2019-10-05: 10 mg via ORAL
  Filled 2019-10-04: qty 1

## 2019-10-04 MED ORDER — ACETAMINOPHEN 325 MG PO TABS: 650.0000 mg | ORAL_TABLET | Freq: Four times a day (QID) | ORAL | Status: DC | PRN

## 2019-10-04 MED ORDER — GADOBUTROL 1 MMOL/ML IV SOLN
10.0000 mL | Freq: Once | INTRAVENOUS | Status: AC | PRN
  Administered 2019-10-04: 10 mL via INTRAVENOUS
  Filled 2019-10-04: qty 10

## 2019-10-04 MED ORDER — ONDANSETRON HCL 4 MG/2ML IJ SOLN: 4.0000 mg | Freq: Four times a day (QID) | INTRAMUSCULAR | Status: DC | PRN

## 2019-10-04 MED ORDER — SODIUM CHLORIDE 0.9 % IV SOLN: 250.0000 mg | Freq: Four times a day (QID) | INTRAVENOUS | Status: DC

## 2019-10-04 MED ORDER — ACETAMINOPHEN 650 MG RE SUPP: 650.0000 mg | Freq: Four times a day (QID) | RECTAL | Status: DC | PRN

## 2019-10-04 MED ORDER — OXYCODONE-ACETAMINOPHEN 5-325 MG PO TABS: 1.0000 | ORAL_TABLET | ORAL | Status: DC | PRN

## 2019-10-04 MED ORDER — MAGNESIUM HYDROXIDE 400 MG/5ML PO SUSP: 30.0000 mL | Freq: Every day | ORAL | Status: DC | PRN

## 2019-10-04 MED ORDER — FAMOTIDINE 20 MG PO TABS
20.0000 mg | ORAL_TABLET | Freq: Two times a day (BID) | ORAL | Status: DC
  Administered 2019-10-05 (×2): 20 mg via ORAL
  Filled 2019-10-04 (×2): qty 1

## 2019-10-04 MED ORDER — KETOROLAC TROMETHAMINE 30 MG/ML IJ SOLN
30.0000 mg | Freq: Once | INTRAMUSCULAR | Status: AC
  Administered 2019-10-04: 30 mg via INTRAMUSCULAR
  Filled 2019-10-04: qty 1

## 2019-10-04 MED ORDER — SODIUM CHLORIDE 0.9 % IV SOLN
1000.0000 mg | Freq: Once | INTRAVENOUS | Status: AC
  Administered 2019-10-04: 1000 mg via INTRAVENOUS
  Filled 2019-10-04: qty 8

## 2019-10-04 MED ORDER — KETOROLAC TROMETHAMINE 30 MG/ML IJ SOLN: 15.0000 mg | Freq: Four times a day (QID) | INTRAMUSCULAR | Status: DC | PRN

## 2019-10-04 MED ORDER — METHYLPREDNISOLONE SODIUM SUCC 125 MG IJ SOLR
125.0000 mg | INTRAMUSCULAR | Status: DC
  Administered 2019-10-05 (×3): 125 mg via INTRAVENOUS
  Filled 2019-10-04 (×3): qty 2

## 2019-10-04 MED ORDER — ONDANSETRON HCL 4 MG/2ML IJ SOLN
4.0000 mg | Freq: Once | INTRAMUSCULAR | Status: AC
  Administered 2019-10-04: 4 mg via INTRAVENOUS
  Filled 2019-10-04: qty 2

## 2019-10-04 MED ORDER — TRAZODONE HCL 50 MG PO TABS: 25.0000 mg | ORAL_TABLET | Freq: Every evening | ORAL | Status: DC | PRN

## 2019-10-04 MED ORDER — ENOXAPARIN SODIUM 40 MG/0.4ML ~~LOC~~ SOLN
40.0000 mg | Freq: Two times a day (BID) | SUBCUTANEOUS | Status: DC
  Filled 2019-10-04: qty 0.4

## 2019-10-04 MED ORDER — SODIUM CHLORIDE 0.9 % IV SOLN: INTRAVENOUS | Status: DC

## 2019-10-04 MED ORDER — ARMODAFINIL 150 MG PO TABS: 150.0000 mg | ORAL_TABLET | Freq: Every day | ORAL | Status: DC

## 2019-10-04 MED ORDER — ONDANSETRON HCL 4 MG PO TABS: 4.0000 mg | ORAL_TABLET | Freq: Four times a day (QID) | ORAL | Status: DC | PRN

## 2019-10-04 NOTE — ED Triage Notes (Signed)
FIRST NURSE NOTE:  Pt here with reports of losing feeling to arm, states it has been going on for about 1 week.  No distress noted on arrival.

## 2019-10-04 NOTE — ED Provider Notes (Signed)
Patient received in signout from Dr. Ermalinda Barrios and follow-up MRI.  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  I discussed the case in consultation with teleneurologist Dr.Berkowitz discussed the presentation.  Given his symptoms starting just over the past week and history does recommend admission for IV steroids and neuro consult.  Agreeable to plan.  Will discuss with hospitalist.   Merlyn Lot, MD 10/04/19 2145

## 2019-10-04 NOTE — H&P (Addendum)
[facility] NAME: Ricardo [name]    MR#:  [id number]  DATE OF BIRTH:  [birth year]  DATE OF ADMISSION:  10/04/2019  PRIMARY CARE PHYSICIAN: [facility] [place]   REQUESTING/REFERRING PHYSICIAN: Merlyn Lot, MD  CHIEF COMPLAINT:   Chief Complaint  Patient presents with  . Numbness    HISTORY OF PRESENT ILLNESS:  Ricardo [name]  is a 34 y.o. African-American male with a known history of asthma, Crohn's disease, GERD, narcolepsy and questionable multiple sclerosis with history of optic neuritis that was previously managed with IV steroids in 2017 and was later thought to be possibly related to Remicade, who presented to the emergency room with acute onset of right-sided numbness and heaviness which has been going on for a week.  He denied any significant weakness of the right extremity.  No other focal muscle weakness.  No dysphagia or dysarthria or visual changes.  He denied any urinary or stool incontinence.  No tinnitus or vertigo.  No headache or dizziness or blurred vision or diplopia.  No fever or chills or nausea or vomiting or abdominal pain.  No chest pain or palpitations or cough or wheezing.  Upon presentation to the emergency room, blood pressure was 149/86 with otherwise normal vital signs.  CMP showed creatinine of 1.29 and CBC mild anemia head CT revealed no acute to preoccupations.  Brain MRI showed mild burden of periventricular white matter T2 hyperintense foci consistent with history of multiple sclerosis. Minimal progression since 2017. No abnormal enhancement to suggest active demyelination.  C-spine MRI showed possible small cord lesion at the C5 level. If not artifactual, this likely reflects chronic demyelination. There is no abnormal enhancement to suggest active demyelination. COVID-19 PCR is currently pending.  The patient was given 30 mg of IM Toradol, 4 mg IV Zofran and 1 g of IV Solu-Medrol after neurology  consultation.  The patient will be admitted to a medically monitored bed for further evaluation and management. PAST MEDICAL HISTORY:   Past Medical History:  Diagnosis Date  . Asthma   . Crohn disease ([place])   . GERD (gastroesophageal reflux disease)   . Narcolepsy     PAST SURGICAL HISTORY:   Past Surgical History:  Procedure Laterality Date  . TONSILLECTOMY      SOCIAL HISTORY:   Social History   Tobacco Use  . Smoking status: Never Smoker  . Smokeless tobacco: Never Used  Substance Use Topics  . Alcohol use: Yes    FAMILY HISTORY:   Family History  Problem Relation Age of Onset  . Hyperlipidemia Mother   . Hypertension Mother   . Diabetes Mother   . Diabetes Father   . Hyperlipidemia Father   . Hypertension Father   . Diabetes Sister     DRUG ALLERGIES:  No Known Allergies  REVIEW OF SYSTEMS:   ROS As per history of present illness. All pertinent systems were reviewed above. Constitutional, HEENT, cardiovascular, respiratory, GI, GU, musculoskeletal, neuro, psychiatric, endocrine, integumentary and hematologic systems were reviewed and are otherwise negative/unremarkable except for positive findings mentioned above in the HPI.   MEDICATIONS AT HOME:   Prior to Admission medications   Medication Sig Start Date End Date Taking? Authorizing Provider  acetaminophen (RA ACETAMINOPHEN) 650 MG CR tablet Take 650 mg by mouth every 8 (eight) weeks. With infusion    [provider]  Armodafinil 150 MG tablet Take 150 mg by mouth daily.    [provider]  cetirizine (ZYRTEC) 10 MG tablet Take 10 mg by mouth every 8 (eight) weeks. With infusion    [provider]  famotidine (PEPCID) 20 MG tablet Take 20 mg by mouth 2 (two) times daily.    [provider]  inFLIXimab (REMICADE) 100 MG injection Inject into the vein every 8 (eight) weeks.    [provider]  oxyCODONE-acetaminophen (ROXICET) 5-325 MG tablet Take 1 tablet  by mouth every 4 (four) hours as needed for severe pain. 01/13/16   Gregor Hams, MD      VITAL SIGNS:  Blood pressure 140/80, pulse 70, temperature 98.4 F (36.9 C), temperature source Oral, resp. rate 18, height 5' 9"  (1.753 m), weight 131.5 kg, SpO2 98 %.  PHYSICAL EXAMINATION:  Physical Exam  GENERAL:  34 y.o.-year-old African-American male patient lying in the bed with no acute distress.  EYES: Pupils equal, round, reactive to light and accommodation. No scleral icterus. Extraocular muscles intact.  HEENT: Head atraumatic, normocephalic. Oropharynx and nasopharynx clear.  NECK:  Supple, no jugular venous distention. No thyroid enlargement, no tenderness.  LUNGS: Normal breath sounds bilaterally, no wheezing, rales,rhonchi or crepitation. No use of accessory muscles of respiration.  CARDIOVASCULAR: Regular rate and rhythm, S1, S2 normal. No murmurs, rubs, or gallops.  ABDOMEN: Soft, nondistended, nontender. Bowel sounds present. No organomegaly or mass.  EXTREMITIES: No pedal edema, cyanosis, or clubbing.  NEUROLOGIC: Cranial nerves II through XII are intact. Muscle strength 5/5 in all extremities. Sensation intact. Gait not checked.  PSYCHIATRIC: The patient is alert and oriented x 3.  Normal affect and good eye contact. SKIN: No obvious rash, lesion, or ulcer.   LABORATORY PANEL:   CBC Recent Labs  Lab 10/04/19 1451  WBC 5.6  HGB 12.8*  HCT 38.8*  PLT 380   ------------------------------------------------------------------------------------------------------------------  Chemistries  Recent Labs  Lab 10/04/19 1451  NA 137  K 4.3  CL 104  CO2 26  GLUCOSE 86  BUN 13  CREATININE 1.29*  CALCIUM 9.0   ------------------------------------------------------------------------------------------------------------------  Cardiac Enzymes No results for input(s): TROPONINI in the last 168  hours. ------------------------------------------------------------------------------------------------------------------  RADIOLOGY:  CT Head Wo Contrast  Result Date: 10/04/2019 CLINICAL DATA:  TIA.  Tingling and numbness EXAM: CT HEAD WITHOUT CONTRAST TECHNIQUE: Contiguous axial images were obtained from the base of the skull through the vertex without intravenous contrast. COMPARISON:  None. FINDINGS: Brain: No acute intracranial abnormality. Specifically, no hemorrhage, hydrocephalus, mass lesion, acute infarction, or significant intracranial injury. Vascular: No hyperdense vessel or unexpected calcification. Skull: No acute calvarial abnormality. Sinuses/Orbits: Visualized paranasal sinuses and mastoids clear. Orbital soft tissues unremarkable. Other: None IMPRESSION: Normal study. Electronically Signed   By: Rolm Baptise M.D.   On: 10/04/2019 14:42   MR Brain W and Wo Contrast  Result Date: 10/04/2019 CLINICAL DATA:  Multiple sclerosis, right arm numbness EXAM: MRI HEAD WITHOUT AND WITH CONTRAST TECHNIQUE: Multiplanar, multiecho pulse sequences of the brain and surrounding structures were obtained without and with intravenous contrast. CONTRAST:  60m GADAVIST GADOBUTROL 1 MMOL/ML IV SOLN COMPARISON:  2017 FINDINGS: Brain: Small focus of T2 hyperintensity is again identified along the ventral body of the corpus callosum at the callososeptal margin. Few additional small foci of T2 hyperintensity are present in the periventricular white matter including a new lesion in the left periatrial white matter. There is no abnormal enhancement. Ventricles and sulci are normal in size and configuration. There is no acute infarction or intracranial hemorrhage there is no intracranial mass, mass  effect, or edema. There is no hydrocephalus or extra-axial fluid collection. Vascular: Major vessel flow voids at the skull base are preserved. Skull and upper cervical spine: Marrow signal is within normal limits.  Sinuses/Orbits: Paranasal sinuses are aerated. Orbits are unremarkable. Other: Sella is unremarkable.  Mastoid air cells are clear. IMPRESSION: Mild burden of periventricular white matter T2 hyperintense foci consistent with history of multiple sclerosis. Minimal progression since 2017. No abnormal enhancement to suggest active demyelination. Electronically Signed   By: Macy Mis M.D.   On: 10/04/2019 20:19   MR Cervical Spine W or Wo Contrast  Result Date: 10/04/2019 CLINICAL DATA:  Multiple sclerosis, right arm numbness EXAM: MRI CERVICAL SPINE WITHOUT AND WITH CONTRAST TECHNIQUE: Multiplanar and multiecho pulse sequences of the cervical spine, to include the craniocervical junction and cervicothoracic junction, were obtained without and with intravenous contrast. CONTRAST:  72m GADAVIST GADOBUTROL 1 MMOL/ML IV SOLN COMPARISON:  2017 FINDINGS: Alignment: Trace retrolisthesis at C4-C5 Vertebrae: Vertebral body heights are preserved. There is no marrow edema or suspicious osseous lesion. Cord: Question of small T2 hyperintense lesion at the right dorsal aspect of the cord at the C5 level. This was not present on the prior study. No abnormal intrathecal enhancement. Posterior Fossa, vertebral arteries, paraspinal tissues: Intracranial findings dictated separately. Otherwise unremarkable. Disc levels: Intervertebral disc heights are maintained. Trace disc bulge at C4-C5. No degenerative stenosis. IMPRESSION: Possible small cord lesion at the C5 level. If not artifactual, this likely reflects chronic demyelination. There is no abnormal enhancement to suggest active demyelination. Electronically Signed   By: PMacy MisM.D.   On: 10/04/2019 20:28      IMPRESSION AND PLAN:   1.  Multiple sclerosis acute exacerbation with right upper extremity heaviness wound care wound and numbness. -The patient will be admitted to the medical monitored bed. -We will continue high-dose IV steroid  therapy. -Neurology consultation will be obtained in a.m. -Notified notified Dr. ZIrish Eldersabout the patient.  2.  Crohn's disease. -The patient is in remission on Stelara injection every 6 weeks.  3.  Narcolepsy. -We will continue armodafinil.  4.  Asthma. -He has no current exacerbation.  5.  GERD. -H2 blocker therapy will be continued.  6.  DVT prophylaxis. -Subcutaneous Lovenox  All the records are reviewed and case discussed with ED provider. The plan of care was discussed in details with the patient (and family). I answered all questions. The patient agreed to proceed with the above mentioned plan. Further management will depend upon hospital course.   CODE STATUS: Full code  Status is: Inpatient  Remains inpatient appropriate because:Ongoing diagnostic testing needed not appropriate for outpatient work up, Unsafe d/c plan, IV treatments appropriate due to intensity of illness or inability to take PO and Inpatient level of care appropriate due to severity of illness   Dispo: The patient is from: Home              Anticipated d/c is to: Home              Anticipated d/c date is: 3 days              Patient currently is not medically stable to d/c.   TOTAL TIME TAKING CARE OF THIS PATIENT: 55 minutes.    JChristel MormonM.D on 10/04/2019 at 10:36 PM  [facility]   From 7 PM-7 AM, contact night-coverage [email or web address]  CC: Primary care physician; [facility]  Note: This dictation was prepared  with Dragon dictation along with smaller phrase technology. Any transcriptional typo errors that result from this process are unintentional.

## 2019-10-04 NOTE — ED Notes (Signed)
To MRI via W/C

## 2019-10-04 NOTE — ED Notes (Signed)
Pt presentation discussed with Dr. Ellender Hose, no bloodwork needed at this time

## 2019-10-04 NOTE — ED Triage Notes (Signed)
Pt arrives via POV from work for reports of right arm tingling and numbness intermittently x 1 week. Pt reports ability to move arm and hand during these episodes and denies pain. Denies any recent injuries. Pt speech clear and gait steady, skin warm and dry. Pt reports this typically happens twice a day.

## 2019-10-04 NOTE — ED Notes (Signed)
Pt resting quietly on ER stretcher, NAD noted, voices no c/o or needs at this time.  Call bell in reach, pt verbalizes understanding of POC.

## 2019-10-04 NOTE — ED Provider Notes (Signed)
[facility] Emergency Department Provider Note   ____________________________________________    I have reviewed the triage vital signs and the nursing notes.   HISTORY  Chief Complaint Numbness     HPI Ricardo [name] is a 34 y.o. male with a history of multiple sclerosis, Crohn's disease presents with complaints of right arm tingling which is intermittent over the last week.  Denies weakness.  Currently no symptoms, no clear pattern to her symptoms.  No headache nausea or vomiting.  No neck pain fall or injury.  Does not take anything for this.  Past Medical History:  Diagnosis Date   Asthma    Crohn disease ([place])    GERD (gastroesophageal reflux disease)    Narcolepsy     Patient Active Problem List   Diagnosis Date Noted   Multiple sclerosis exacerbation ([place]) 10/04/2019   Optic neuritis, left    Multiple sclerosis ([place])    Optic neuritis    Blurry vision, left eye 07/31/2015   Crohn disease ([place])    Narcolepsy    GERD (gastroesophageal reflux disease)    Vision changes     Past Surgical History:  Procedure Laterality Date   TONSILLECTOMY      Prior to Admission medications   Medication Sig Start Date End Date Taking? Authorizing Provider  acetaminophen (RA ACETAMINOPHEN) 650 MG CR tablet Take 650 mg by mouth every 8 (eight) weeks. With infusion   Yes [provider]  Armodafinil 150 MG tablet Take 150 mg by mouth daily. Patient not taking: Reported on 10/05/2019    [provider]  cetirizine (ZYRTEC) 10 MG tablet Take 10 mg by mouth every 8 (eight) weeks. With infusion Patient not taking: Reported on 10/05/2019    [provider]  famotidine (PEPCID) 20 MG tablet Take 20 mg by mouth 2 (two) times daily.    [provider]  oxyCODONE-acetaminophen (ROXICET) 5-325 MG tablet Take 1 tablet by mouth every 4 (four) hours as needed for severe pain. Patient not taking: Reported on  10/05/2019 01/13/16   Gregor Hams, MD     Allergies Patient has no known allergies.  Family History  Problem Relation Age of Onset   Hyperlipidemia Mother    Hypertension Mother    Diabetes Mother    Diabetes Father    Hyperlipidemia Father    Hypertension Father    Diabetes Sister     Social History Social History   Tobacco Use   Smoking status: Never Smoker   Smokeless tobacco: Never Used  Substance Use Topics   Alcohol use: Yes   Drug use: Never    Review of Systems  Constitutional: No fever/chills Eyes: No visual changes.  ENT: No neck pain Cardiovascular: Denies chest pain. Respiratory: Denies shortness of breath. Gastrointestinal: No vomiting Genitourinary: No complaints of GU symptoms Musculoskeletal: No neck pain Skin: Negative for rash. Neurological: As above   ____________________________________________   PHYSICAL EXAM:  VITAL SIGNS: ED Triage Vitals  Enc Vitals Group     BP 10/04/19 1143 (!) 149/86     Pulse Rate 10/04/19 1143 77     Resp 10/04/19 1143 18     Temp 10/04/19 1143 98.4 F (36.9 C)     Temp Source 10/04/19 1143 Oral     SpO2 10/04/19 1143 100 %     Weight 10/04/19 1147 131.5 kg (290 lb)     Height 10/04/19 1147 1.753 m (5' 9" )     Head Circumference --  Peak Flow --      Pain Score 10/04/19 1143 0     Pain Loc --      Pain Edu? --      Excl. in [place]? --     Constitutional: Alert and oriented.  Eyes: Conjunctivae are normal.   Nose: No congestion/rhinnorhea. Mouth/Throat: Mucous membranes are moist.   Neck:  Painless ROM, no vertebral tenderness palpation, no pain with axial load Cardiovascular: Normal rate, regular rhythm. Good peripheral circulation. Respiratory: Normal respiratory effort.  No retractions. Gastrointestinal: Soft and nontender. No distention.    Musculoskeletal:   Warm and well perfused Neurologic:  Normal speech and language. No gross focal neurologic deficits are appreciated.    Skin:  Skin is warm, dry and intact. No rash noted. Psychiatric: Mood and affect are normal. Speech and behavior are normal.  ____________________________________________   LABS (all labs ordered are listed, but only abnormal results are displayed)  Labs Reviewed  CBC - Abnormal; Notable for the following components:      Result Value   Hemoglobin 12.8 (*)    HCT 38.8 (*)    MCV 75.5 (*)    MCH 24.9 (*)    All other components within normal limits  BASIC METABOLIC PANEL - Abnormal; Notable for the following components:   Creatinine, Ser 1.29 (*)    All other components within normal limits  BASIC METABOLIC PANEL - Abnormal; Notable for the following components:   Glucose, Bld 138 (*)    All other components within normal limits  CBC - Abnormal; Notable for the following components:   MCV 79.2 (*)    MCH 24.3 (*)    All other components within normal limits  SARS CORONAVIRUS 2 BY RT PCR (HOSPITAL ORDER, [facility])  HIV ANTIBODY (ROUTINE TESTING W REFLEX)   ____________________________________________  EKG  None ____________________________________________  RADIOLOGY  CT head ____________________________________________   PROCEDURES  Procedure(s) performed: No  Procedures   Critical Care performed: No ____________________________________________   INITIAL IMPRESSION / ASSESSMENT AND PLAN / ED COURSE  Pertinent labs & imaging results that were available during my care of the patient were reviewed by me and considered in my medical decision making (see chart for details).  Patient presents with right arm tingling and pins and needle sensations in the right arm.  Differential includes cervical radiculopathy, nonspecific paresthesias, multiple sclerosis flare  Discussed with patient regarding concerns about multiple sclerosis flare, he is clarified that he does not think that he has a formal diagnosis of multiple sclerosis.  Reviewed  medical records and apparently patient developed optic neuritis in 2017 which required admission and did improve with IV steroids.  During that time had a nonspecific MRI performed which was suspicious but not diagnostic for MS.  Prior to that he had been on Remicade for Crohn's disease and it was felt that the Remicade was likely the cause of the optic neuritis.  He has not followed up with neurology since then.  CT scan is normal.  Possibly does exist for multiple sclerosis flare will send for MRI with and without contrast      ____________________________________________   FINAL CLINICAL IMPRESSION(S) / ED DIAGNOSES  Final diagnoses:  Right arm numbness        Note:  This document was prepared using Dragon voice recognition software and may include unintentional dictation errors.   Lavonia Drafts, MD 10/09/19 [phone]

## 2019-10-04 NOTE — ED Notes (Signed)
See triage note  Presents with intermittent numbness to right arm  States this has been happening and lasts only a short while  States he had an episode while at work today  Grips equal

## 2019-10-05 DIAGNOSIS — G35 Multiple sclerosis: Secondary | ICD-10-CM | POA: Diagnosis not present

## 2019-10-05 DIAGNOSIS — R200 Anesthesia of skin: Secondary | ICD-10-CM

## 2019-10-05 LAB — CBC
HCT: 43 % (ref 39.0–52.0)
Hemoglobin: 13.2 g/dL (ref 13.0–17.0)
MCH: 24.3 pg — ABNORMAL LOW (ref 26.0–34.0)
MCHC: 30.7 g/dL (ref 30.0–36.0)
MCV: 79.2 fL — ABNORMAL LOW (ref 80.0–100.0)
Platelets: 397 10*3/uL (ref 150–400)
RBC: 5.43 MIL/uL (ref 4.22–5.81)
RDW: 14.2 % (ref 11.5–15.5)
WBC: 5.1 10*3/uL (ref 4.0–10.5)
nRBC: 0 % (ref 0.0–0.2)

## 2019-10-05 LAB — BASIC METABOLIC PANEL
Anion gap: 9 (ref 5–15)
BUN: 16 mg/dL (ref 6–20)
CO2: 26 mmol/L (ref 22–32)
Calcium: 9.3 mg/dL (ref 8.9–10.3)
Chloride: 103 mmol/L (ref 98–111)
Creatinine, Ser: 1.22 mg/dL (ref 0.61–1.24)
GFR calc Af Amer: >60 mL/min (ref >60)
GFR calc non Af Amer: >60 mL/min (ref >60)
Glucose, Bld: 138 mg/dL — ABNORMAL HIGH (ref 70–99)
Potassium: 4.1 mmol/L (ref 3.5–5.1)
Sodium: 138 mmol/L (ref 135–145)

## 2019-10-05 LAB — HIV ANTIBODY (ROUTINE TESTING W REFLEX): HIV Screen 4th Generation wRfx: NONREACTIVE

## 2019-10-05 MED ORDER — SODIUM CHLORIDE 0.9 % IV SOLN
500.0000 mg | Freq: Two times a day (BID) | INTRAVENOUS | Status: DC
  Filled 2019-10-05: qty 4

## 2019-10-05 MED ORDER — SODIUM CHLORIDE 0.9 % IV SOLN
500.0000 mg | Freq: Once | INTRAVENOUS | Status: AC
  Administered 2019-10-05: 500 mg via INTRAVENOUS
  Filled 2019-10-05: qty 4

## 2019-10-05 NOTE — Progress Notes (Signed)
Patient arrived to 1A-137. Patient has orders for telemetry but there are currently no monitors available on this floor nor are there any available on other units at this time. Provider notified, awaiting response.

## 2019-10-05 NOTE — Progress Notes (Signed)
°   10/05/19 0800  Clinical Encounter Type  Visited With Patient  Visit Type Initial;Spiritual support;Social support  Referral From Nurse  Consult/Referral To Chaplain  Responded to OR. Ch provided Pt with AD education. Pt wanted to talk about what the Bible say about divorce. He said he did not want to go into too many details. Pt knew what the Bible say about divorce. Ch will follow-up with Pt.

## 2019-10-05 NOTE — Discharge Summary (Signed)
Physician Discharge Summary  Patient ID: AMAD MAU MRN: [id number] DOB/AGE: [birth year] 33 y.o.  Admit date: 10/04/2019 Discharge date: 10/05/2019  Admission Diagnoses:  Discharge Diagnoses:  Active Problems:   Multiple sclerosis exacerbation [facility])   Discharged Condition: good  Hospital Course:  MontaNesmithis a33 y.o.African-American malewith a known history of asthma, Crohn's disease, GERD, narcolepsy and questionable multiple sclerosis with history of optic neuritis who present to the hospital with right-sided numbness and tingling and heaviness for a week.  CT of the head did not show any acute changes.  Brain MRI showed mild burden of periventricular white matter T2 hyperintensity consistent with history of multiple sclerosis.  Patient was started on high-dose of Solu-Medrol at 1 g/day after discussion with neurology.  Patient is seen by Dr. Irish Elders today, I spoke with him, he has reviewed patient MRI of the brain and the cervical spine, lower probability this is due to MS.  He recommended give patient a final dose of 500 mg of Solu-Medrol, patient can be discharged home today with follow-up with neurology. Currently, patient still has some tingling and numbness of the right arm, but a symptom improved.  Is medically stable to be discharged.    Consults: neurology  Significant Diagnostic Studies:  MRI CERVICAL SPINE WITHOUT AND WITH CONTRAST  TECHNIQUE: Multiplanar and multiecho pulse sequences of the cervical spine, to include the craniocervical junction and cervicothoracic junction, were obtained without and with intravenous contrast.  CONTRAST:  42m GADAVIST GADOBUTROL 1 MMOL/ML IV SOLN  COMPARISON:  2017  FINDINGS: Alignment: Trace retrolisthesis at C4-C5  Vertebrae: Vertebral body heights are preserved. There is no marrow edema or suspicious osseous lesion.  Cord: Question of small T2 hyperintense lesion at the right dorsal aspect of the cord  at the C5 level. This was not present on the prior study. No abnormal intrathecal enhancement.  Posterior Fossa, vertebral arteries, paraspinal tissues: Intracranial findings dictated separately. Otherwise unremarkable.  Disc levels: Intervertebral disc heights are maintained. Trace disc bulge at C4-C5. No degenerative stenosis.  IMPRESSION: Possible small cord lesion at the C5 level. If not artifactual, this likely reflects chronic demyelination. There is no abnormal enhancement to suggest active demyelination.   Electronically Signed   By: PMacy MisM.D.   On: 10/04/2019 20:28  MRI HEAD WITHOUT AND WITH CONTRAST  TECHNIQUE: Multiplanar, multiecho pulse sequences of the brain and surrounding structures were obtained without and with intravenous contrast.  CONTRAST:  140mGADAVIST GADOBUTROL 1 MMOL/ML IV SOLN  COMPARISON:  2017  FINDINGS: Brain: Small focus of T2 hyperintensity is again identified along the ventral body of the corpus callosum at the callososeptal margin. Few additional small foci of T2 hyperintensity are present in the periventricular white matter including a new lesion in the left periatrial white matter. There is no abnormal enhancement.  Ventricles and sulci are normal in size and configuration. There is no acute infarction or intracranial hemorrhage there is no intracranial mass, mass effect, or edema. There is no hydrocephalus or extra-axial fluid collection.  Vascular: Major vessel flow voids at the skull base are preserved.  Skull and upper cervical spine: Marrow signal is within normal limits.  Sinuses/Orbits: Paranasal sinuses are aerated. Orbits are unremarkable.  Other: Sella is unremarkable.  Mastoid air cells are clear.  IMPRESSION: Mild burden of periventricular white matter T2 hyperintense foci consistent with history of multiple sclerosis. Minimal progression since 2017. No abnormal enhancement to suggest  active demyelination.   Electronically Signed   By: PrAddison Lank.  On: 10/04/2019 20:19   Treatments: IV steroids  Discharge Exam: Blood pressure (!) 142/80, pulse 69, temperature 98 F (36.7 C), temperature source Oral, resp. rate 17, height 5' 9"  (1.753 m), weight 131.5 kg, SpO2 100 %. General appearance: alert and cooperative Resp: clear to auscultation bilaterally Cardio: regular rate and rhythm, S1, S2 normal, no murmur, click, rub or gallop GI: soft, non-tender; bowel sounds normal; no masses,  no organomegaly Extremities: extremities normal, atraumatic, no cyanosis or edema  Disposition: Discharge disposition: 01-Home or Self Care       Discharge Instructions    Diet - low sodium heart healthy   Complete by: As directed    Increase activity slowly   Complete by: As directed      Allergies as of 10/05/2019   No Known Allergies     Medication List    STOP taking these medications   inFLIXimab 100 MG injection Commonly known as: REMICADE     TAKE these medications   Armodafinil 150 MG tablet Take 150 mg by mouth daily.   cetirizine 10 MG tablet Commonly known as: ZYRTEC Take 10 mg by mouth every 8 (eight) weeks. With infusion   famotidine 20 MG tablet Commonly known as: PEPCID Take 20 mg by mouth 2 (two) times daily.   oxyCODONE-acetaminophen 5-325 MG tablet Commonly known as: Roxicet Take 1 tablet by mouth every 4 (four) hours as needed for severe pain.   RA Acetaminophen 650 MG CR tablet Generic drug: acetaminophen Take 650 mg by mouth every 8 (eight) weeks. With infusion       Follow-up [place], [facility] Follow up in 1 week(s).   Contact information: [place] [address] [phone]        [facility] Follow up in 2 week(s).   Contact information: [place] [phone]               Signed: Sharen Hones 10/05/2019, 12:38 PM

## 2019-10-05 NOTE — Progress Notes (Signed)
PROGRESS NOTE    [name]  QTM:[id number] DOB: June 26, 1985 DOA: 10/04/2019 PCP: [facility]   Chief complaint.  Right arm numbness and tingling.   Brief Narrative:  Ricardo [name]  is a 34 y.o. African-American male with a known history of asthma, Crohn's disease, GERD, narcolepsy and questionable multiple sclerosis with history of optic neuritis who present to the hospital with right-sided numbness and tingling and heaviness for a week.  CT of the head did not show any acute changes.  Brain MRI showed mild burden of periventricular white matter T2 hyperintensity consistent with history of multiple sclerosis.  Patient was started on high-dose of Solu-Medrol at 1 g/day after discussion with neurology.   Assessment & Plan:   Active Problems:   Multiple sclerosis exacerbation ([place])  #1.  Multiple sclerosis with acute exacerbation. Continue IV steroids at high dose.  Appreciate neurology consult. Discontinue IV fluids as patient has adequate p.o. intake.  2.  Crohn disease. In remission.  3.  Narcolepsy. Continue home medicine.  4.  Asthma. No exacerbation.  5.  Morbid obesity. BMI 42.8.  DVT prophylaxis: Lovenox Code Status: Full Family Communication: None Disposition Plan:  . Patient came from: Home            . Anticipated d/c place: Home . Barriers to d/c OR conditions which need to be met to effect a safe d/c:   Consultants:   Neurology.  Procedures: None Antimicrobials:None  Subjective: Patient still feels some heaviness tingling and numbness in her right arm.  No incontinence of urine.  Denies any short of breath or cough.  No fever or chills.  Objective: Vitals:   10/04/19 1403 10/04/19 2348 10/05/19 0406 10/05/19 0755  BP: 140/80 132/87 127/77 139/72  Pulse: 70 66 73 (!) 58  Resp: 18 17 16 17   Temp:  98 F (36.7 C) 97.7 F (36.5 C) (!) 97.5 F (36.4 C)  TempSrc:  Oral Oral Oral  SpO2: 98% 96% 100% 100%  Weight:  131.5  kg    Height:  5' 9"  (1.753 m)      Intake/Output Summary (Last 24 hours) at 10/05/2019 1126 Last data filed at 10/05/2019 0957 Gross per 24 hour  Intake 1381.93 ml  Output --  Net 1381.93 ml   Filed Weights   10/04/19 1147 10/04/19 2348  Weight: 131.5 kg 131.5 kg    Examination:  General exam: Appears calm and comfortable.  Morbid obese. Respiratory system: Clear to auscultation. Respiratory effort normal. Cardiovascular system: S1 & S2 heard, RRR. No JVD, murmurs, rubs, gallops or clicks. No pedal edema. Gastrointestinal system: Abdomen is nondistended, soft and nontender. No organomegaly or masses felt. Normal bowel sounds heard. Central nervous system: Alert and oriented. No focal neurological deficits. Extremities: Symmetric 5 x 5 power. Skin: No rashes, lesions or ulcers Psychiatry: Judgement and insight appear normal. Mood & affect appropriate.     Data Reviewed: I have personally reviewed following labs and imaging studies  CBC: Recent Labs  Lab 10/04/19 1451 10/05/19 0417  WBC 5.6 5.1  HGB 12.8* 13.2  HCT 38.8* 43.0  MCV 75.5* 79.2*  PLT 380 625   Basic Metabolic Panel: Recent Labs  Lab 10/04/19 1451 10/05/19 0417  NA 137 138  K 4.3 4.1  CL 104 103  CO2 26 26  GLUCOSE 86 138*  BUN 13 16  CREATININE 1.29* 1.22  CALCIUM 9.0 9.3   GFR: Estimated Creatinine Clearance: 116.8 mL/min (by C-G formula based on SCr of  1.22 mg/dL). Liver Function Tests: No results for input(s): AST, ALT, ALKPHOS, BILITOT, PROT, ALBUMIN in the last 168 hours. No results for input(s): LIPASE, AMYLASE in the last 168 hours. No results for input(s): AMMONIA in the last 168 hours. Coagulation Profile: No results for input(s): INR, PROTIME in the last 168 hours. Cardiac Enzymes: No results for input(s): CKTOTAL, CKMB, CKMBINDEX, TROPONINI in the last 168 hours. BNP (last 3 results) No results for input(s): PROBNP in the last 8760 hours. HbA1C: No results for input(s): HGBA1C  in the last 72 hours. CBG: No results for input(s): GLUCAP in the last 168 hours. Lipid Profile: No results for input(s): CHOL, HDL, LDLCALC, TRIG, CHOLHDL, LDLDIRECT in the last 72 hours. Thyroid Function Tests: No results for input(s): TSH, T4TOTAL, FREET4, T3FREE, THYROIDAB in the last 72 hours. Anemia Panel: No results for input(s): VITAMINB12, FOLATE, FERRITIN, TIBC, IRON, RETICCTPCT in the last 72 hours. Sepsis Labs: No results for input(s): PROCALCITON, LATICACIDVEN in the last 168 hours.  Recent Results (from the past 240 hour(s))  SARS Coronavirus 2 by RT PCR (hospital order, performed in [facility] hospital lab) Nasopharyngeal Nasopharyngeal Swab     Status: None   Collection Time: 10/04/19 10:21 PM   Specimen: Nasopharyngeal Swab  Result Value Ref Range Status   SARS Coronavirus 2 NEGATIVE NEGATIVE Final    Comment: (NOTE) SARS-CoV-2 target nucleic acids are NOT DETECTED.  The SARS-CoV-2 RNA is generally detectable in upper and lower respiratory specimens during the acute phase of infection. The lowest concentration of SARS-CoV-2 viral copies this assay can detect is 250 copies / mL. A negative result does not preclude SARS-CoV-2 infection and should not be used as the sole basis for treatment or other patient management decisions.  A negative result may occur with improper specimen collection / handling, submission of specimen other than nasopharyngeal swab, presence of viral mutation(s) within the areas targeted by this assay, and inadequate number of viral copies (<250 copies / mL). A negative result must be combined with clinical observations, patient history, and epidemiological information.  Fact Sheet for Patients:   StrictlyIdeas.no  Fact Sheet for Healthcare Providers: BankingDealers.co.za  This test is not yet approved or  cleared by the [place] FDA and has been authorized for detection and/or diagnosis  of SARS-CoV-2 by FDA under an Emergency Use Authorization (EUA).  This EUA will remain in effect (meaning this test can be used) for the duration of the COVID-19 declaration under Section 564(b)(1) of the Act, 21 U.S.C. section 360bbb-3(b)(1), unless the authorization is terminated or revoked sooner.  Performed at [facility], [address] [place], [place] [address]          Radiology Studies: CT Head Wo Contrast  Result Date: 10/04/2019 CLINICAL DATA:  TIA.  Tingling and numbness EXAM: CT HEAD WITHOUT CONTRAST TECHNIQUE: Contiguous axial images were obtained from the base of the skull through the vertex without intravenous contrast. COMPARISON:  None. FINDINGS: Brain: No acute intracranial abnormality. Specifically, no hemorrhage, hydrocephalus, mass lesion, acute infarction, or significant intracranial injury. Vascular: No hyperdense vessel or unexpected calcification. Skull: No acute calvarial abnormality. Sinuses/Orbits: Visualized paranasal sinuses and mastoids clear. Orbital soft tissues unremarkable. Other: None IMPRESSION: Normal study. Electronically Signed   By: Rolm Baptise M.D.   On: 10/04/2019 14:42   MR Brain W and Wo Contrast  Result Date: 10/04/2019 CLINICAL DATA:  Multiple sclerosis, right arm numbness EXAM: MRI HEAD WITHOUT AND WITH CONTRAST TECHNIQUE: Multiplanar, multiecho pulse sequences of the  brain and surrounding structures were obtained without and with intravenous contrast. CONTRAST:  40m GADAVIST GADOBUTROL 1 MMOL/ML IV SOLN COMPARISON:  2017 FINDINGS: Brain: Small focus of T2 hyperintensity is again identified along the ventral body of the corpus callosum at the callososeptal margin. Few additional small foci of T2 hyperintensity are present in the periventricular white matter including a new lesion in the left periatrial white matter. There is no abnormal enhancement. Ventricles and sulci are normal in size and configuration. There is no acute  infarction or intracranial hemorrhage there is no intracranial mass, mass effect, or edema. There is no hydrocephalus or extra-axial fluid collection. Vascular: Major vessel flow voids at the skull base are preserved. Skull and upper cervical spine: Marrow signal is within normal limits. Sinuses/Orbits: Paranasal sinuses are aerated. Orbits are unremarkable. Other: Sella is unremarkable.  Mastoid air cells are clear. IMPRESSION: Mild burden of periventricular white matter T2 hyperintense foci consistent with history of multiple sclerosis. Minimal progression since 2017. No abnormal enhancement to suggest active demyelination. Electronically Signed   By: PMacy MisM.D.   On: 10/04/2019 20:19   MR Cervical Spine W or Wo Contrast  Result Date: 10/04/2019 CLINICAL DATA:  Multiple sclerosis, right arm numbness EXAM: MRI CERVICAL SPINE WITHOUT AND WITH CONTRAST TECHNIQUE: Multiplanar and multiecho pulse sequences of the cervical spine, to include the craniocervical junction and cervicothoracic junction, were obtained without and with intravenous contrast. CONTRAST:  115mGADAVIST GADOBUTROL 1 MMOL/ML IV SOLN COMPARISON:  2017 FINDINGS: Alignment: Trace retrolisthesis at C4-C5 Vertebrae: Vertebral body heights are preserved. There is no marrow edema or suspicious osseous lesion. Cord: Question of small T2 hyperintense lesion at the right dorsal aspect of the cord at the C5 level. This was not present on the prior study. No abnormal intrathecal enhancement. Posterior Fossa, vertebral arteries, paraspinal tissues: Intracranial findings dictated separately. Otherwise unremarkable. Disc levels: Intervertebral disc heights are maintained. Trace disc bulge at C4-C5. No degenerative stenosis. IMPRESSION: Possible small cord lesion at the C5 level. If not artifactual, this likely reflects chronic demyelination. There is no abnormal enhancement to suggest active demyelination. Electronically Signed   By: PrMacy MisM.D.   On: 10/04/2019 20:28        Scheduled Meds: . Armodafinil  150 mg Oral Daily  . enoxaparin (LOVENOX) injection  40 mg Subcutaneous BID  . famotidine  20 mg Oral BID  . loratadine  10 mg Oral Daily   Continuous Infusions: . methylPREDNISolone (SOLU-MEDROL) injection       LOS: 1 day    Time spent: 28 minutes    DeSharen HonesMD [facility]   To contact the attending provider between 7A-7P or the covering provider during after hours 7P-7A, please log into the web site [email or web address] and access using universal [place] password for that web site. If you do not have the password, please call the hospital operator.  10/05/2019, 11:26 AM

## 2019-10-05 NOTE — Plan of Care (Signed)

## 2019-10-05 NOTE — Consult Note (Signed)
Reason for Consult: R sided heaviness  Requesting Physician: Dr. Roosevelt Locks   CC: R sided heaviness   HPI: Ricardo [name] is an 34 y.o. male with a known history of asthma, Crohn's disease, GERD, narcolepsy and questionable multiple sclerosis with history of optic neuritis who present to the hospital with right-sided numbness and tingling and heaviness for a week.  CT of the head did not show any acute changes.  Brain MRI showed mild burden of periventricular white matter T2 hyperintensity and C spine C5 lesion which is chromic. S/p 1g solumedrol yesterday Currently back to baseline.   Past Medical History:  Diagnosis Date  . Asthma   . Crohn disease ([place])   . GERD (gastroesophageal reflux disease)   . Narcolepsy     Past Surgical History:  Procedure Laterality Date  . TONSILLECTOMY      Family History  Problem Relation Age of Onset  . Hyperlipidemia Mother   . Hypertension Mother   . Diabetes Mother   . Diabetes Father   . Hyperlipidemia Father   . Hypertension Father   . Diabetes Sister     Social History:  reports that he has never smoked. He has never used smokeless tobacco. He reports current alcohol use. He reports that he does not use drugs.  No Known Allergies  Medications: I have reviewed the patient's current medications.  ROS: History obtained from the patient  General ROS: negative for - chills, fatigue, fever, night sweats, weight gain or weight loss Psychological ROS: negative for - behavioral disorder, hallucinations, memory difficulties, mood swings or suicidal ideation Ophthalmic ROS: negative for - blurry vision, double vision, eye pain or loss of vision ENT ROS: negative for - epistaxis, nasal discharge, oral lesions, sore throat, tinnitus or vertigo Allergy and Immunology ROS: negative for - hives or itchy/watery eyes Hematological and Lymphatic ROS: negative for - bleeding problems, bruising or swollen lymph nodes Endocrine ROS: negative for -  galactorrhea, hair pattern changes, polydipsia/polyuria or temperature intolerance Respiratory ROS: negative for - cough, hemoptysis, shortness of breath or wheezing Cardiovascular ROS: negative for - chest pain, dyspnea on exertion, edema or irregular heartbeat Gastrointestinal ROS: negative for - abdominal pain, diarrhea, hematemesis, nausea/vomiting or stool incontinence Genito-Urinary ROS: negative for - dysuria, hematuria, incontinence or urinary frequency/urgency Musculoskeletal ROS: negative for - joint swelling or muscular weakness Neurological ROS: as noted in HPI Dermatological ROS: negative for rash and skin lesion changes  Physical Examination: Blood pressure 139/72, pulse (!) 58, temperature (!) 97.5 F (36.4 C), temperature source Oral, resp. rate 17, height 5' 9"  (1.753 m), weight 131.5 kg, SpO2 100 %.   Neurological Examination   Mental Status: Alert, oriented, thought content appropriate.  Speech fluent without evidence of aphasia.  Able to follow 3 step commands without difficulty. Cranial Nerves: II: Discs flat bilaterally; Visual fields grossly normal, pupils equal, round, reactive to light and accommodation III,IV, VI: ptosis not present, extra-ocular motions intact bilaterally V,VII: smile symmetric, facial light touch sensation normal bilaterally VIII: hearing normal bilaterally IX,X: gag reflex present XI: bilateral shoulder shrug XII: midline tongue extension Motor: Right : Upper extremity   5/5    Left:     Upper extremity   5/5  Lower extremity   5/5     Lower extremity   5/5 Tone and bulk:normal tone throughout; no atrophy noted Sensory: Pinprick and light touch intact throughout, bilaterally Deep Tendon Reflexes: 2+ and symmetric throughout Plantars: Right: downgoing   Left: downgoing Cerebellar: normal finger-to-nose, normal  rapid alternating movements and normal heel-to-shin test Gait: normal gait and station      Laboratory Studies:   Basic  Metabolic Panel: Recent Labs  Lab 10/04/19 1451 10/05/19 0417  NA 137 138  K 4.3 4.1  CL 104 103  CO2 26 26  GLUCOSE 86 138*  BUN 13 16  CREATININE 1.29* 1.22  CALCIUM 9.0 9.3    Liver Function Tests: No results for input(s): AST, ALT, ALKPHOS, BILITOT, PROT, ALBUMIN in the last 168 hours. No results for input(s): LIPASE, AMYLASE in the last 168 hours. No results for input(s): AMMONIA in the last 168 hours.  CBC: Recent Labs  Lab 10/04/19 1451 10/05/19 0417  WBC 5.6 5.1  HGB 12.8* 13.2  HCT 38.8* 43.0  MCV 75.5* 79.2*  PLT 380 397    Cardiac Enzymes: No results for input(s): CKTOTAL, CKMB, CKMBINDEX, TROPONINI in the last 168 hours.  BNP: Invalid input(s): POCBNP  CBG: No results for input(s): GLUCAP in the last 168 hours.  Microbiology: Results for orders placed or performed during the hospital encounter of 10/04/19  SARS Coronavirus 2 by RT PCR (hospital order, performed in [facility] hospital lab) Nasopharyngeal Nasopharyngeal Swab     Status: None   Collection Time: 10/04/19 10:21 PM   Specimen: Nasopharyngeal Swab  Result Value Ref Range Status   SARS Coronavirus 2 NEGATIVE NEGATIVE Final    Comment: (NOTE) SARS-CoV-2 target nucleic acids are NOT DETECTED.  The SARS-CoV-2 RNA is generally detectable in upper and lower respiratory specimens during the acute phase of infection. The lowest concentration of SARS-CoV-2 viral copies this assay can detect is 250 copies / mL. A negative result does not preclude SARS-CoV-2 infection and should not be used as the sole basis for treatment or other patient management decisions.  A negative result may occur with improper specimen collection / handling, submission of specimen other than nasopharyngeal swab, presence of viral mutation(s) within the areas targeted by this assay, and inadequate number of viral copies (<250 copies / mL). A negative result must be combined with clinical observations, patient history,  and epidemiological information.  Fact Sheet for Patients:   StrictlyIdeas.no  Fact Sheet for Healthcare Providers: BankingDealers.co.za  This test is not yet approved or  cleared by the [place] FDA and has been authorized for detection and/or diagnosis of SARS-CoV-2 by FDA under an Emergency Use Authorization (EUA).  This EUA will remain in effect (meaning this test can be used) for the duration of the COVID-19 declaration under Section 564(b)(1) of the Act, 21 U.S.C. section 360bbb-3(b)(1), unless the authorization is terminated or revoked sooner.  Performed at [facility], [address] [place], [place] [address]     Coagulation Studies: No results for input(s): LABPROT, INR in the last 72 hours.  Urinalysis: No results for input(s): COLORURINE, LABSPEC, PHURINE, GLUCOSEU, HGBUR, BILIRUBINUR, KETONESUR, PROTEINUR, UROBILINOGEN, NITRITE, LEUKOCYTESUR in the last 168 hours.  Invalid input(s): APPERANCEUR  Lipid Panel:  No results found for: CHOL, TRIG, HDL, CHOLHDL, VLDL, LDLCALC  HgbA1C: No results found for: HGBA1C  Urine Drug Screen:  No results found for: LABOPIA, COCAINSCRNUR, LABBENZ, AMPHETMU, THCU, LABBARB  Alcohol Level: No results for input(s): ETH in the last 168 hours.  Other results: EKG: normal EKG, normal sinus rhythm, unchanged from previous tracings.  Imaging: CT Head Wo Contrast  Result Date: 10/04/2019 CLINICAL DATA:  TIA.  Tingling and numbness EXAM: CT HEAD WITHOUT CONTRAST TECHNIQUE: Contiguous axial images were obtained from the base of the  skull through the vertex without intravenous contrast. COMPARISON:  None. FINDINGS: Brain: No acute intracranial abnormality. Specifically, no hemorrhage, hydrocephalus, mass lesion, acute infarction, or significant intracranial injury. Vascular: No hyperdense vessel or unexpected calcification. Skull: No acute calvarial abnormality. Sinuses/Orbits:  Visualized paranasal sinuses and mastoids clear. Orbital soft tissues unremarkable. Other: None IMPRESSION: Normal study. Electronically Signed   By: Rolm Baptise M.D.   On: 10/04/2019 14:42   MR Brain W and Wo Contrast  Result Date: 10/04/2019 CLINICAL DATA:  Multiple sclerosis, right arm numbness EXAM: MRI HEAD WITHOUT AND WITH CONTRAST TECHNIQUE: Multiplanar, multiecho pulse sequences of the brain and surrounding structures were obtained without and with intravenous contrast. CONTRAST:  11m GADAVIST GADOBUTROL 1 MMOL/ML IV SOLN COMPARISON:  2017 FINDINGS: Brain: Small focus of T2 hyperintensity is again identified along the ventral body of the corpus callosum at the callososeptal margin. Few additional small foci of T2 hyperintensity are present in the periventricular white matter including a new lesion in the left periatrial white matter. There is no abnormal enhancement. Ventricles and sulci are normal in size and configuration. There is no acute infarction or intracranial hemorrhage there is no intracranial mass, mass effect, or edema. There is no hydrocephalus or extra-axial fluid collection. Vascular: Major vessel flow voids at the skull base are preserved. Skull and upper cervical spine: Marrow signal is within normal limits. Sinuses/Orbits: Paranasal sinuses are aerated. Orbits are unremarkable. Other: Sella is unremarkable.  Mastoid air cells are clear. IMPRESSION: Mild burden of periventricular white matter T2 hyperintense foci consistent with history of multiple sclerosis. Minimal progression since 2017. No abnormal enhancement to suggest active demyelination. Electronically Signed   By: PMacy MisM.D.   On: 10/04/2019 20:19   MR Cervical Spine W or Wo Contrast  Result Date: 10/04/2019 CLINICAL DATA:  Multiple sclerosis, right arm numbness EXAM: MRI CERVICAL SPINE WITHOUT AND WITH CONTRAST TECHNIQUE: Multiplanar and multiecho pulse sequences of the cervical spine, to include the  craniocervical junction and cervicothoracic junction, were obtained without and with intravenous contrast. CONTRAST:  121mGADAVIST GADOBUTROL 1 MMOL/ML IV SOLN COMPARISON:  2017 FINDINGS: Alignment: Trace retrolisthesis at C4-C5 Vertebrae: Vertebral body heights are preserved. There is no marrow edema or suspicious osseous lesion. Cord: Question of small T2 hyperintense lesion at the right dorsal aspect of the cord at the C5 level. This was not present on the prior study. No abnormal intrathecal enhancement. Posterior Fossa, vertebral arteries, paraspinal tissues: Intracranial findings dictated separately. Otherwise unremarkable. Disc levels: Intervertebral disc heights are maintained. Trace disc bulge at C4-C5. No degenerative stenosis. IMPRESSION: Possible small cord lesion at the C5 level. If not artifactual, this likely reflects chronic demyelination. There is no abnormal enhancement to suggest active demyelination. Electronically Signed   By: PrMacy Mis.D.   On: 10/04/2019 20:28     Assessment/Plan:  [age].o. male with a known history of asthma, Crohn's disease, GERD, narcolepsy and questionable multiple sclerosis with history of optic neuritis who present to the hospital with right-sided numbness and tingling and heaviness for a week.  CT of the head did not show any acute changes.  Brain MRI showed mild burden of periventricular white matter T2 hyperintensity and C spine C5 lesion which is chromic. S/p 1g solumedrol yesterday Currently back to baseline.   - T2 hyperintensities can be seen from chronic HTN as well.  Pt has been diagnosed with HTN - MRI done with and w/out contrast and no acute lesions seen - He needs follow up with  Neurology for diagnosis and see if has MS.  Would need LP  - Can follow up Dr. Manuella Ghazi and Melrose Nakayama as out patient - he is back to baseline - 1 more dose solumedrol today and d/c today 10/05/2019, 12:27 PM

## 2019-10-05 NOTE — Progress Notes (Signed)
Patient ok to stay without telemetry until one becomes available, per Dr. Sidney Ace.

## 2019-10-25 ENCOUNTER — Other Ambulatory Visit: Payer: Self-pay | Admitting: Acute Care

## 2019-10-25 DIAGNOSIS — G35 Multiple sclerosis: Secondary | ICD-10-CM

## 2019-10-31 ENCOUNTER — Ambulatory Visit
Admission: RE | Admit: 2019-10-31 | Discharge: 2019-10-31 | Disposition: A | Discharge status: Home / Self Care | Payer: Managed Care, Other (non HMO) | Source: Ambulatory Visit | Attending: Acute Care | Admitting: Acute Care

## 2019-10-31 ENCOUNTER — Other Ambulatory Visit: Payer: Self-pay

## 2019-10-31 DIAGNOSIS — G35 Multiple sclerosis: Secondary | ICD-10-CM

## 2019-10-31 LAB — GLUCOSE, CSF: Glucose, CSF: 55 mg/dL (ref 40–70)

## 2019-10-31 LAB — CBC WITH DIFFERENTIAL/PLATELET
Abs Immature Granulocytes: 0 10*3/uL (ref 0.00–0.07)
Basophils Absolute: 0 10*3/uL (ref 0.0–0.1)
Basophils Relative: 1 %
Eosinophils Absolute: 0.1 10*3/uL (ref 0.0–0.5)
Eosinophils Relative: 2 %
HCT: 40.8 % (ref 39.0–52.0)
Hemoglobin: 12.8 g/dL — ABNORMAL LOW (ref 13.0–17.0)
Immature Granulocytes: 0 %
Lymphocytes Relative: 43 %
Lymphs Abs: 1.9 10*3/uL (ref 0.7–4.0)
MCH: 24.4 pg — ABNORMAL LOW (ref 26.0–34.0)
MCHC: 31.4 g/dL (ref 30.0–36.0)
MCV: 77.9 fL — ABNORMAL LOW (ref 80.0–100.0)
Monocytes Absolute: 0.4 10*3/uL (ref 0.1–1.0)
Monocytes Relative: 8 %
Neutro Abs: 2.1 10*3/uL (ref 1.7–7.7)
Neutrophils Relative %: 46 %
Platelets: 457 10*3/uL — ABNORMAL HIGH (ref 150–400)
RBC: 5.24 MIL/uL (ref 4.22–5.81)
RDW: 14.3 % (ref 11.5–15.5)
WBC: 4.5 10*3/uL (ref 4.0–10.5)
nRBC: 0 % (ref 0.0–0.2)

## 2019-10-31 LAB — CSF CELL COUNT WITH DIFFERENTIAL
Eosinophils, CSF: 2 %
Lymphs, CSF: 56 %
Monocyte-Macrophage-Spinal Fluid: 14 %
RBC Count, CSF: 10552 /mm3 — ABNORMAL HIGH (ref 0–3)
Segmented Neutrophils-CSF: 28 %
Tube #: 1
WBC, CSF: 25 /mm3 (ref 0–5)

## 2019-10-31 LAB — PROTIME-INR
INR: 1.1 (ref 0.8–1.2)
Prothrombin Time: 13.5 seconds (ref 11.4–15.2)

## 2019-10-31 LAB — ALBUMIN: Albumin: 4.2 g/dL (ref 3.5–5.0)

## 2019-10-31 LAB — APTT: aPTT: 29 seconds (ref 24–36)

## 2019-10-31 LAB — PROTEIN, CSF: Total  Protein, CSF: 60 mg/dL — ABNORMAL HIGH (ref 15–45)

## 2019-10-31 NOTE — Progress Notes (Signed)
Returned to room 23 from radiology.  Side rails up, call light in reach, HOB flat.

## 2019-10-31 NOTE — Discharge Instructions (Signed)
Lumbar Puncture, Care After This sheet gives you information about how to care for yourself after your procedure. Your health care provider may also give you more specific instructions. If you have problems or questions, contact your health care provider. What can I expect after the procedure? After the procedure, it is common to have:  Mild discomfort or pain at the puncture site.  A mild headache that is relieved with pain medicines. Follow these instructions at home: Activity   Lie down flat or rest for as long as directed by your health care provider.  Return to your normal activities as told by your health care provider. Ask your health care provider what activities are safe for you.  Avoid lifting anything heavier than 10 lb (4.5 kg) for at least 12 hours after the procedure.  Do not drive for 24 hours if you were given a medicine to help you relax (sedative) during your procedure.  Do not drive or use heavy machinery while taking prescription pain medicine. Puncture site care  Remove or change your bandage (dressing) as told by your health care provider.  Check your puncture area every day for signs of infection. Check for: ? More pain. ? Redness or swelling. ? Fluid or blood leaking from the puncture site. ? Warmth. ? Pus or a bad smell. General instructions  Take over-the-counter and prescription medicines only as told by your health care provider.  Drink enough fluids to keep your urine clear or pale yellow. Your health care provider may recommend drinking caffeine to prevent a headache.  Keep all follow-up visits as told by your health care provider. This is important. Contact a health care provider if:  You have fever or chills.  You have nausea or vomiting.  You have a headache that lasts for more than 2 days or does not get better with medicine. Get help right away if:  You develop any of the following in your  legs: ? Weakness. ? Numbness. ? Tingling.  You are unable to control when you urinate or have a bowel movement (incontinence).  You have signs of infection around your puncture site, such as: ? More pain. ? Redness or swelling. ? Fluid or blood leakage. ? Warmth. ? Pus or a bad smell.  You are dizzy or you feel like you might faint.  You have a severe headache, especially when you sit or stand. Summary  A lumbar puncture is a procedure in which a small needle is inserted into the lower back to remove fluid that surrounds the brain and spinal cord.  After this procedure, it is common to have a headache and pain around the needle insertion area.  Lying flat, staying hydrated, and drinking caffeine can help prevent headaches.  Monitor your needle insertion site for signs of infection, including warmth, fluid, or more pain.  Get help right away if you develop leg weakness, leg numbness, incontinence, or severe headaches. This information is not intended to replace advice given to you by your health care provider. Make sure you discuss any questions you have with your health care provider. Document Revised: 04/20/2016 Document Reviewed: 04/20/2016 Elsevier Patient Education  2020 [employer].

## 2019-10-31 NOTE — Progress Notes (Signed)
Reported critical high WBC on CSF to Courteney in Dr. Lannie Fields office.  Patient is asymptomatic.  "Okay to discharge to home".

## 2019-10-31 NOTE — Progress Notes (Signed)
Discharge instructions reviewed with patient and wife.  Verbalize understanding.

## 2019-11-01 LAB — IGG, IGA, IGM
IgA: 260 mg/dL (ref 90–386)
IgG (Immunoglobin G), Serum: 1694 mg/dL — ABNORMAL HIGH (ref 603–1613)
IgM (Immunoglobulin M), Srm: 145 mg/dL (ref 20–172)

## 2019-11-03 LAB — CSF CULTURE W GRAM STAIN
Culture: NO GROWTH
Gram Stain: NONE SEEN

## 2019-11-04 LAB — OLIGOCLONAL BANDS, CSF + SERM

## 2019-11-05 LAB — IGG CSF INDEX
Albumin: 4.4 g/dL (ref 4.0–5.0)
CSF IgG Index: UNDETERMINED
IgG (Immunoglobin G), Serum: 1623 mg/dL — ABNORMAL HIGH (ref 603–1613)

## 2019-11-05 LAB — HSV(HERPES SMPLX VRS)ABS-I+II(IGG)-CSF

## 2019-12-13 DIAGNOSIS — R202 Paresthesia of skin: Secondary | ICD-10-CM | POA: Insufficient documentation

## 2020-12-27 IMAGING — CT CT HEAD W/O CM
3 series · 15 of 46 positions shown, 18 images · non-contrast
Comparison: None.

CLINICAL DATA: TIA.  Tingling and numbness

EXAM:
CT HEAD WITHOUT CONTRAST
TECHNIQUE: Contiguous axial images were obtained from the base of the skull
through the vertex without intravenous contrast.

[Series 2: head wo · axial · 0.42mm/px · z∈[+465,+585]mm · 9 of 29 slices shown, 12 images]
[im 3/29  brain]
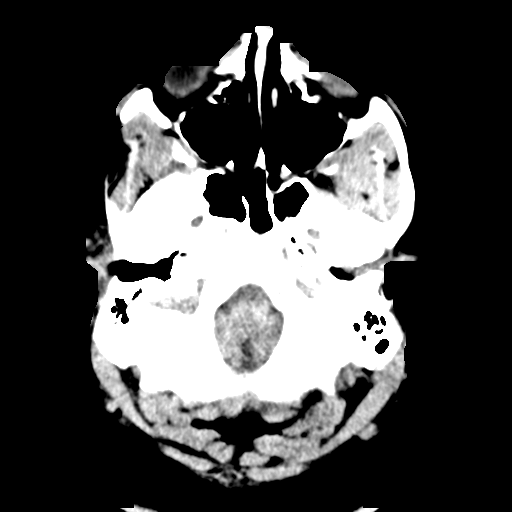
[im 3/29  bone]
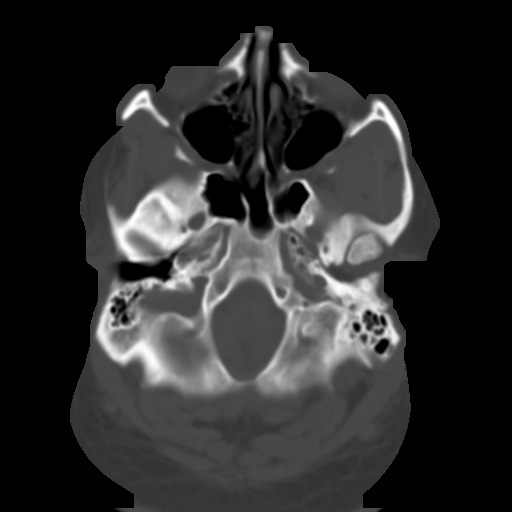
[im 6/29  brain]
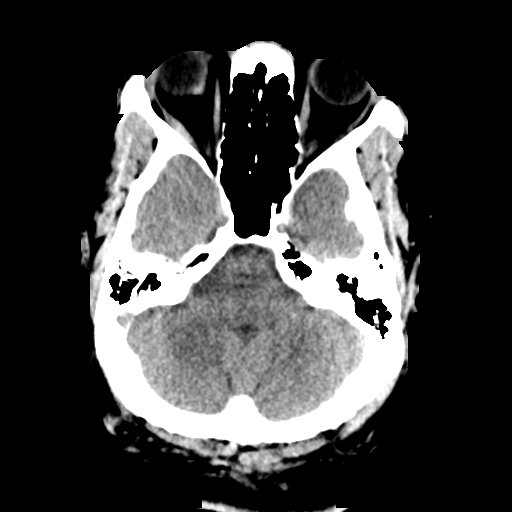
[im 9/29  brain]
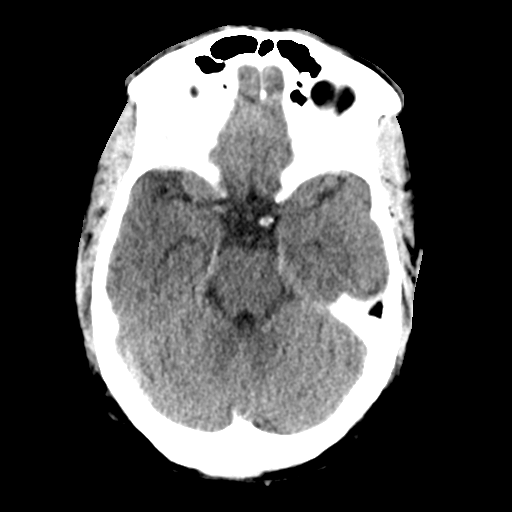
[im 12/29  brain]
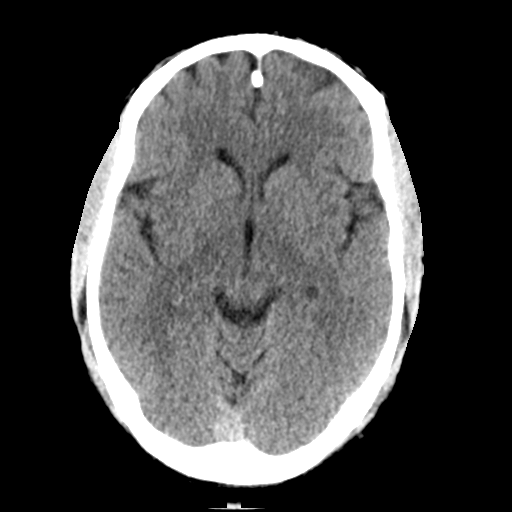
[im 15/29  brain]
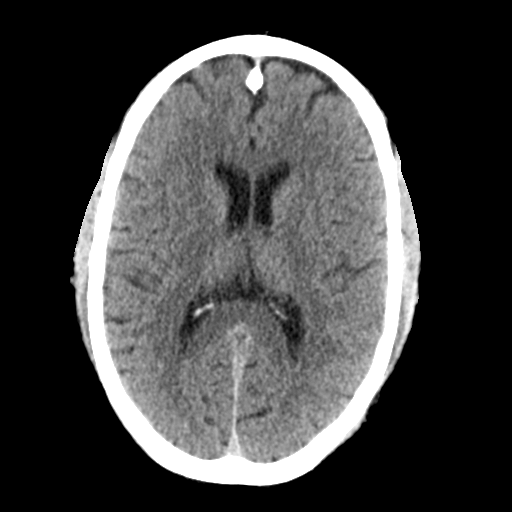
[im 15/29  bone]
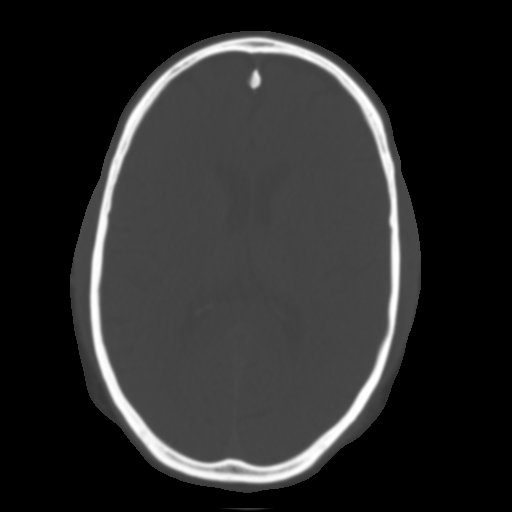
[im 18/29  brain]
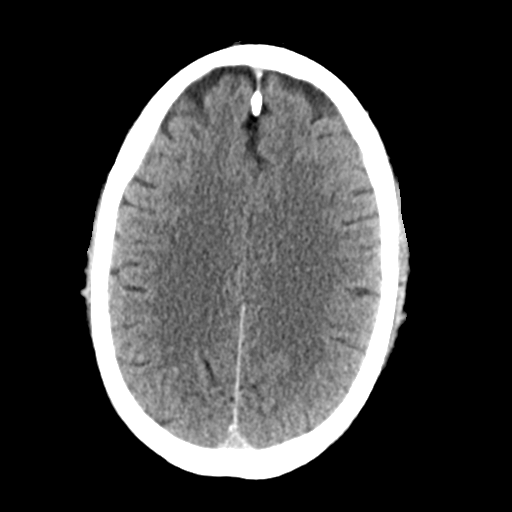
[im 21/29  brain]
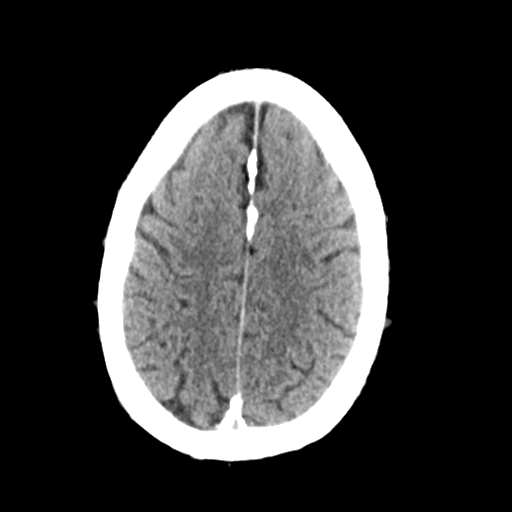
[im 24/29  brain]
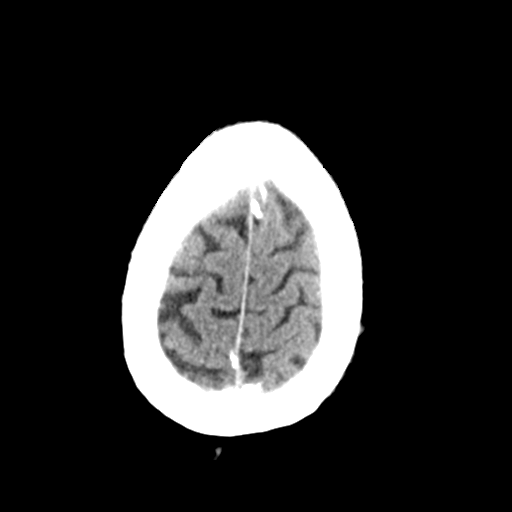
[im 27/29  brain]
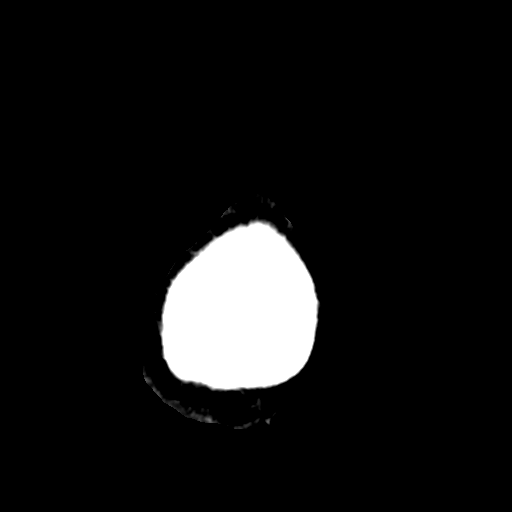
[im 27/29  bone]
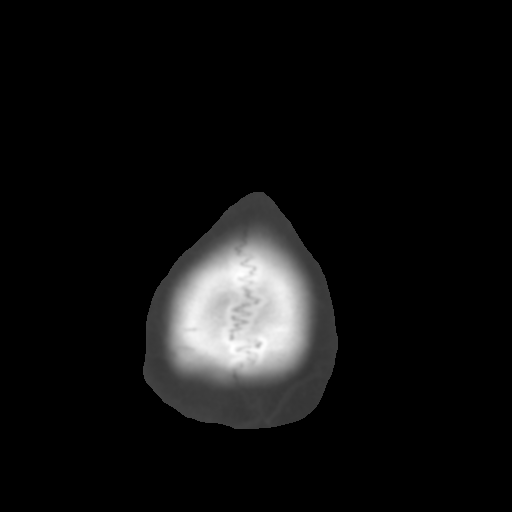

[Series 4: coronal soft tissue · coronal · 0.29mm/px · 3 of 76 slices shown]
[im 26/76  brain]
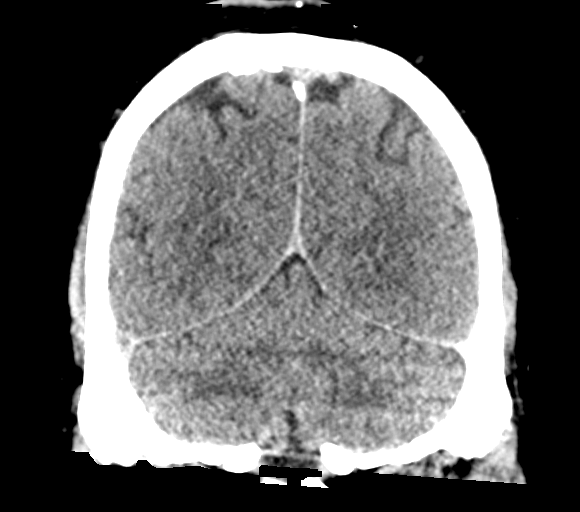
[im 34/76  brain]
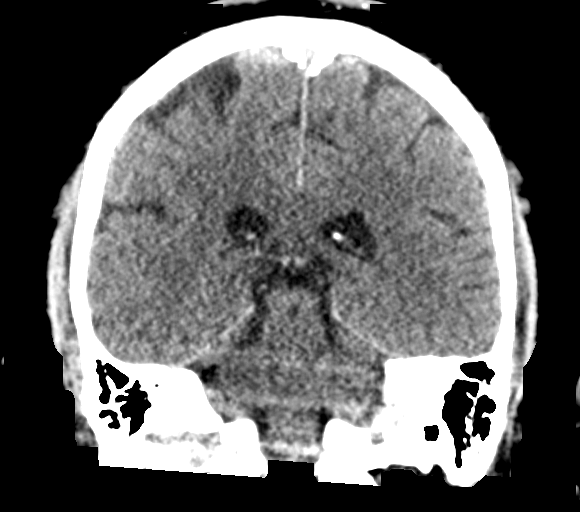
[im 42/76  brain]
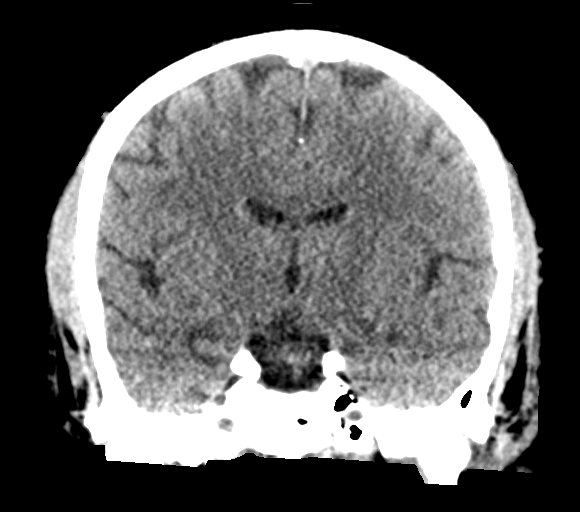

[Series 5: sagittal soft tissue · sagittal · 0.29mm/px · 3 of 58 slices shown]
[im 20/58  brain]
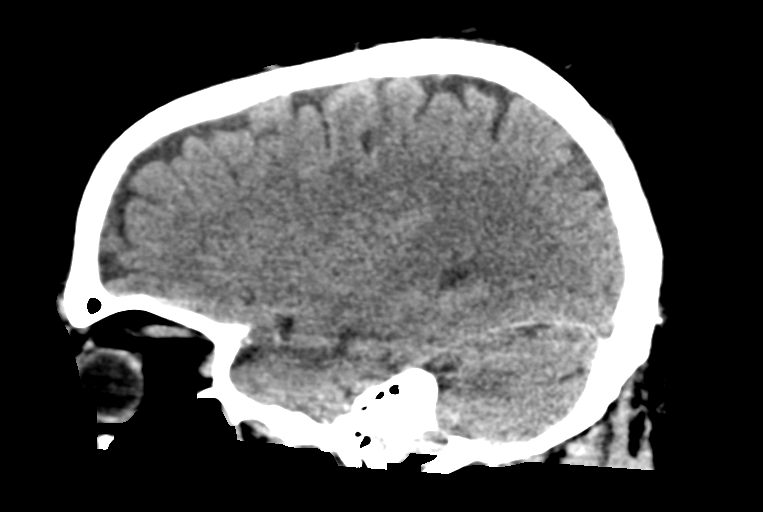
[im 29/58  brain]
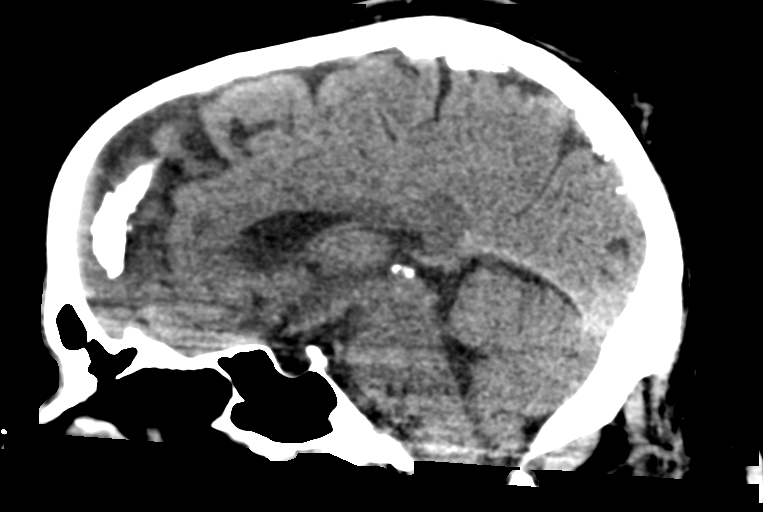
[im 39/58  brain]
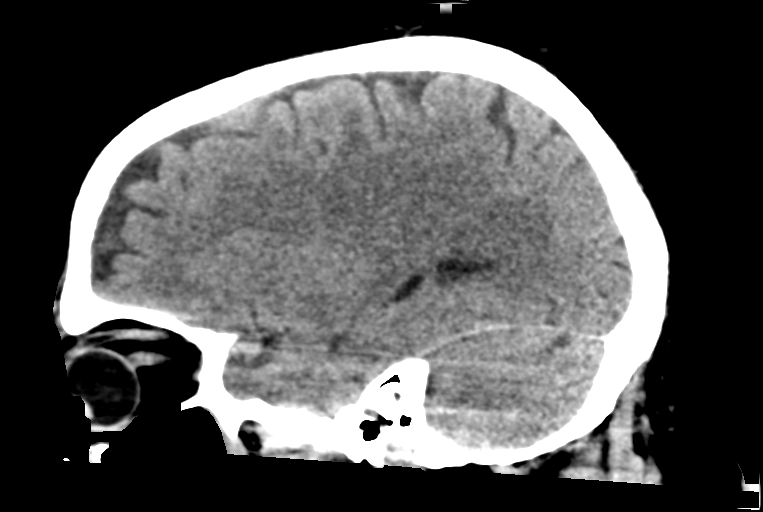

[15 of 46 positions shown; findings below may reference images not displayed]

FINDINGS: Brain: No acute intracranial abnormality. Specifically, no
hemorrhage, hydrocephalus, mass lesion, acute infarction, or
significant intracranial injury.

Vascular: No hyperdense vessel or unexpected calcification.

Skull: No acute calvarial abnormality.

Sinuses/Orbits: Visualized paranasal sinuses and mastoids clear.
Orbital soft tissues unremarkable.

Other: None
IMPRESSION: Normal study.

## 2021-01-23 IMAGING — RF DG FLUORO GUIDE SPINAL/SI JT INJ*L*
1 series · 2 of 2 positions shown · non-contrast
Comparison: none

CLINICAL DATA: Diagnostic lumbar puncture, evaluate for MS

[Series 1: fluoro_iodine 2fps_bw · 0.17mm/px · 2 of 2 frames shown]
[frame 1/2]
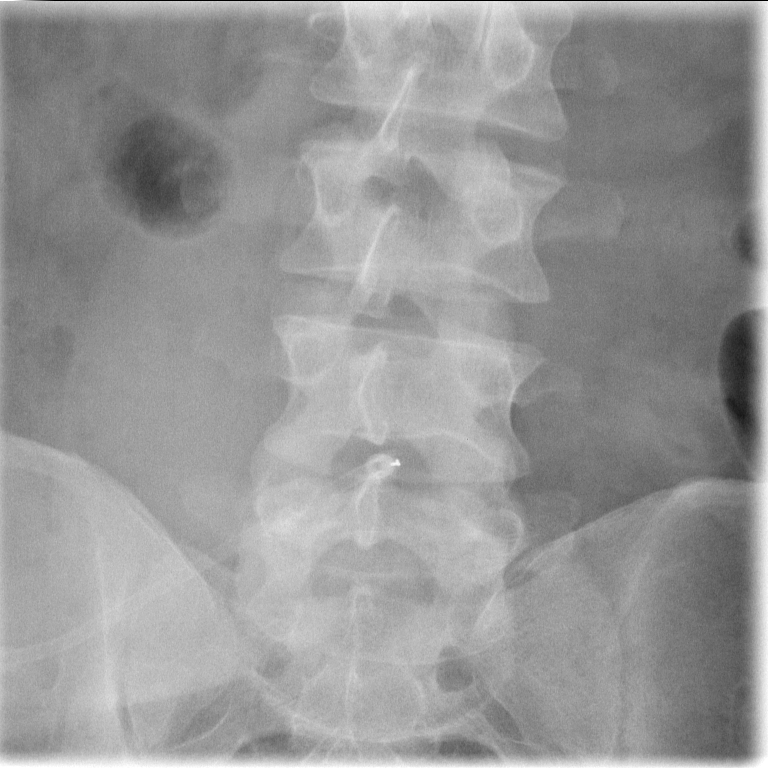
[frame 2/2]
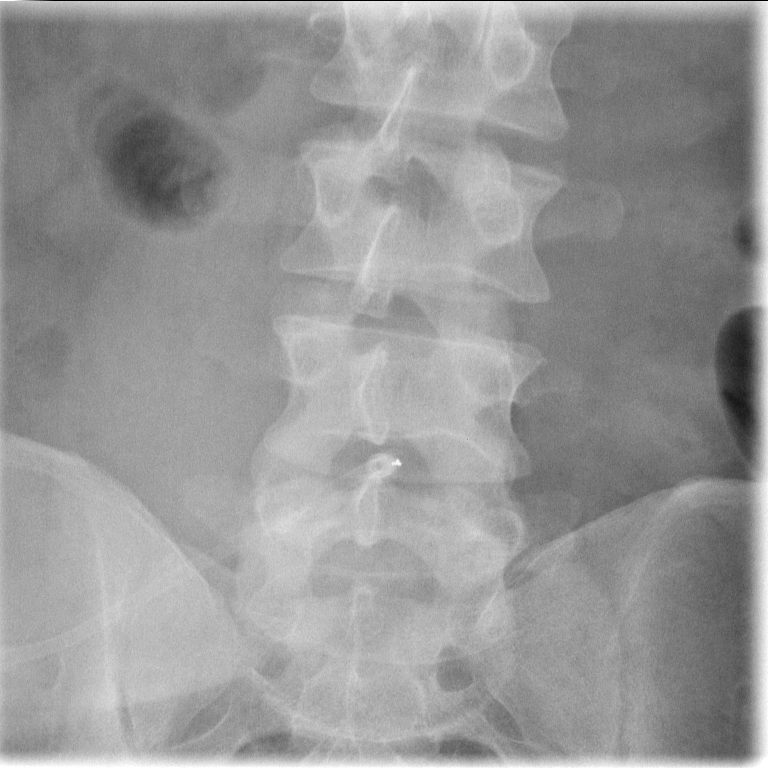

[2 of 2 positions shown; findings below may reference images not displayed]

EXAM:
DIAGNOSTIC LUMBAR PUNCTURE UNDER FLUOROSCOPIC GUIDANCE

FLUOROSCOPY TIME:  Fluoroscopy Time:  [DATE]

Number of Acquired Spot Images: 1

PROCEDURE:
Informed consent was obtained from the patient prior to the
procedure, including potential complications of headache, allergy,
and pain. With the patient prone, the lower back was prepped with
Betadine. 1% Lidocaine was used for local anesthesia. Lumbar
puncture was performed at the L4-L5 level using a 22 gauge needle
with return of slightly bloody CSF. A total of 2 ml of CSF were
obtained for laboratory studies, there was limited return of CSF
with multiple repositioning attempts. The patient tolerated the
procedure well and there were no apparent complications.
IMPRESSION: Successful lumbar puncture, a total of 2 mL of CSF were obtained for
laboratory studies with multiple needle repositioning attempts.

## 2021-08-11 NOTE — Progress Notes (Signed)
  Today's Presentation   We last had the privilege of assisting Ricardo [name] with his multiple sclerosis care on 08Oct21.  He was clinically stable and interested in starting diroximel fumarate.  We concluded the visit by completing the necessary start forms and mae plans for follow-up in 4 months to see how things were going on this new medication.  Since then, he reached out with clinical concerns/questions about his changed preference to start ocrelizumab.  Our team proceeded to assist with this plan.  Extended chart review reveals some interval health seeking behavior within linked organizations outside of [facility] but he was lost to follow-up here.  Today, Ricardo [name] team, please begin text from clinical interview here]   Relevant Medical & Surgical History   Multiple Sclerosis Clinical History (last updated: 24May23) RicardoGuerra first developed symptoms in 2017 and was later diagnosed in 2021 from clinical findings, neuroimaging and CSF analysis.  He now carries a lesion burden within the brain and cervical spine and has dealt with numbness/paresthesias in right arm and vision changes in left eye.    For disease management, he has taken no disease modifier (newly diagnosed) since 85, with stable surveillance imaging on 16Jul21 against his 15May17 baselines.  Previously, RicardoForst took no disease modifying therapies.  If ever needed, he prefers/tolerates (unrecorded) for relapse care.   Relevant Diagnostic Studies   Imaging and Procedures  . No (new) imaging studies or procedures have been performed or else no new studies are available within [facility] PACS or [employer] for review  Available Surveillance Lab Results Lab Results  Component Value Date   WBC 5.0 11/15/2019   LYMPHCT 2.4 11/15/2019   LYMPHOPCT 47.0 11/15/2019   AST 30 11/15/2019   ALT 30 11/15/2019   VITD 11 (L) 11/15/2019   LABVARI 832 11/15/2019   QUANTIFERON Negative 11/15/2019   HBSAG NonReactive  11/15/2019   HCVAB NonReactive 11/15/2019   IGG 1,760 (H) 11/15/2019   IGA 233 11/15/2019   IGM 133 11/15/2019   IGE 113 11/15/2019     I personally performed the service.  (TP)   F. Jama Coombes, MD MPH Assistant Professor of Neurology Division of Neuroimmunology [facility] 08/11/2021

## 2021-08-11 NOTE — Patient Instructions (Signed)
 What we discussed today, and what we're doing....   It was great to talk with you again.  A breakdown of our current plan is detailed in your clinic note, which is available through the MyChart Portal.  At this point, I would suggest that you read about glatiramer (COPAXONE) or natalizumab (TYSABRI).  For unbiased and unbranded information about any of the MS medications currently on market, consider reviewing each drug's information on the national MS society's website below:  AwarenessAdvisor.com.cy  As part of your visit today, we have ordered blood tests that evaluate the safety of these drugs for you.   Three things to always remember.  1) Everyone's experience with MS is as unique as they are. 2) There's more to MS than meets MRI. 3) While MS can influence your experience of almost any symptom, it won't be the cause every symptom.  For information on the implications of Coronavirus to your care, please check out the following website:  https://www.nationalmssociety.org/What-you-need-to-know-about-Coronavirus-(COVID-19)  You might also enjoy a weekly podcast sponsored by the [employer], which you can find at   [email or web address]  Some Helpful Information   If you have (or suspect you have) COVID-19 Please check out the following handout available online (copy and paste from the electronic version of your after visit summary):  [email or web address]  If you're curious about any test results conducted here ... Please know that you can view most of them through the MyChart Portal.  Rest assured that I'm reviewing them, too.  If I see anything urgent or worrisome, I will let you know.  Otherwise, I'd like to explain the results to you in person at our next visit.  Between Visit Communication... Direct Provider Messages in MyChart is a better version of a Dr. Archie.  Use it to ask questions about your health or  medications.  HOWEVER, if you need help with something, please CALL INSTEAD.  The best number to use is [phone].  Since you suffer from Multiple Sclerosis.... Please contact us  at [phone] if you have any new symptoms emerge that last for more than two days, or any old symptoms that worsen/return for more than four days.  (Two for new, and four for old)  If you ever need paperwork completed.... We're happy to help with any forms you need completed, and we want to do the best job possible.  To this end, please bring these forms with you to your visit so we can discuss how to tailor responses to your needs.  Because the quality of the work suffers greatly without your direct input, we reserve the right to refuse paperwork completion between visits.  How can you obtain your records... With the MyChart system, you can review significant portions of your record online.  If you are unable to obtain access, or if you need copies of items that do not appear in MyChart, [facility] can be reached at [phone] or on the web at [email or web address]  Our Fax [phone]   Sincerely,     F. Jama Coombes, III MD MPH Assistant Professor of Neurology Division of Neuroimmunology [facility]     Contact Information for [facility] Neurology   Nurse Triage Line: [phone], option 5, option 3 Monday-Friday 8:00am-4pm  Please call this number to report symptoms, or if you have any questions or concerns prior to your next appointment. Please have the following information available when calling: patient's name, date of birth or medical record number and provider's  name. In case of emergency, please call 911 or go to the closest Emergency Department.  MyChart Messages For your safety and best care, please DO NOT use MyChart messages to report symptoms. (Symptoms should be reported by calling the nurse triage line).  Please use MyChart for non-urgent matters such as general questions, non-urgent prescription refills, or non-urgent scheduling issues.   Please do not use MyChart for URGENT messages, as messages are only checked during regular business hours.   Please note that MyChart messages may be routed to a central pool and one of your provider's team members will get back to you. - Expect up to 3 business days for response   If your provider is listed as out of office and you need assistance, please call the nurse triage line so the on call provider can be contacted.   Appointment Hub: [phone] Monday-Friday 7:30am-4:30pm If you have questions or concerns regarding your appointments or need to make changes to your appointments, please contact the appointment hub.   Lab and test results:  During your visit, your provider may order lab work, radiology exams or other tests. These results may take up to 7- 10 business days and results may be communicated to you by our office in the following ways:   If you have signed up for MyChart, your results will be sent via a private message to your MyChart account. If you do not use your MyChart account, it is important to let our clinical staff know.   You may receive a letter in the mail with results.   If you are scheduled for a follow up appointment soon after your tests are completed, your provider may wish to wait and discuss them during the follow up appointment.   Refills/Prior Authorizations:  Please take your medications as prescribed. Have your pharmacy contact us  for refills at least 1 week before you run out of medication. Please allow 2-4 weeks for the processing of prior authorizations.   Insurance / FMLA Paperwork: Please allow for a 4 week turnaround time for all paperwork submitted.   Medical Records:  If you would like a copy of your medical records or would like your records sent to a third party (providers, insurance, attorneys, etc.).  Submit requests through [facility]. Download the HIM/ROI Authorization Form using the form links below. Once you have completed the form, choose one of the following options to send it to us : Email it to [email or web address] (link: [email or web address] Fax it to: [phone] Mail it to:  Health Information Management [facility] [address] [place], [place]  [address]   Clinical Notes on My Chart:  Progress notes documented by your healthcare team will now be available on the Mychart portal. We believe that patients should be a part of the healthcare team. We encourage you to review notes after visits and in preparation for upcoming appointments. This provides the opportunity to review recommendations as well as to prepare questions for your healthcare team to address during your next visit. If you identify discrepancies in the documentation or have specific questions related to the notes, please bring them to your next scheduled visit to discuss with your physician, nurse practitioner, or physician assistant. With increased transparency, our hope is that we create more trust, better communication, more shared decision-making, and increased satisfaction.   Please be aware that these notes will not be discussed over the phone or through My Chart message, only at your next office visit with your provider.  Arrival time, late arrivals and no show policies:   Please arrive 20 minutes prior to your appointment time to have sufficient time to register you, and have a comprehensive nursing assessment and check in.   Patients who arrive late to their appointment may be asked to reschedule.   If you are unable to keep your scheduled appointment, we kindly request that you call more than 24 hours in advance to reschedule. Failure to provide 24 hour advance notice, may result in your inability to schedule future appointments or choose your reschedule date.

## 2021-12-17 NOTE — Progress Notes (Signed)
 No chief complaint on file.   Today's Presentation   We last had the privilege of assisting Ricardo [name] with his multiple sclerosis care on 08/11/2021. During that earlier visit, he was coming to reestablish management for his relapsing remitting multiple sclerosis since his last visit in 2021. Since that time, he had been on Stelara, which has the potential for interacting with ocrelizumab, and so as a result, we had to think about alternative medications. Given his history of Crohn's, it may be advantageous for him to consolidate his care to the use of natalizumab. I mentioned how I would be more than happy to facilitate this, but we would need to first touch base with his gastroenterologist to see if they were comfortable with this transition alternately. If it turns out that this is not an option for whatever reason, and his best approach for his MS would probably be to start glatiramer. Information of both medications was provided, and we made plans for a follow-up visit in 4 months unless otherwise requested.  Since then, he reached out once in 08/2021 with information to share about the name of his gastroenterologist, but has not reached out since. He has since exchange a series of telephone calls with his gastroenterologist, and he is currently scheduled to meet with his gastroenterologist on 12/28/2021 at [facility].  Today's Presentation  Today, Ricardo [name] has been doing fairly well. He did not have any questions or concerns today.  Relevant Medical & Surgical History   Past Medical History:  Diagnosis Date  . Carpal tunnel syndrome of right wrist   . Crohn's disease (CMS-HCC)   . GERD (gastroesophageal reflux disease)   . Hypersomnia 06/08/2015  . Morbid obesity with BMI of 40.0-44.9, adult (CMS-HCC) 01/27/2015  . 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 Neuritis    Multiple Sclerosis Clinical History (last updated: 24May23) Ricardo [name] first developed symptoms in 2017  and was later diagnosed in 2021 from clinical findings, neuroimaging and CSF analysis.  He now carries a lesion burden within the brain and cervical spine and has dealt with numbness/paresthesias in right arm and vision changes in left eye.     For disease management, he has taken no disease modifier (newly diagnosed) since 85, with stable surveillance imaging on 16Jul21 against his 15May17 baselines.  Previously, Ricardo [name] took no disease modifying therapies.  If ever needed, he prefers/tolerates (unrecorded) for relapse care.    Medications & Allergies   Current Outpatient Medications  Medication Sig Dispense Refill  . famotidine  (PEPCID ) 20 MG tablet Take 1 tablet by mouth 2 (two) times daily.    SABRA gabapentin (NEURONTIN) 300 MG capsule On day 1, take one pill in the AM.  On day 2, take one in the AM and one in PM.  On day 3, take one pill three times daily (Patient not taking: Reported on 08/11/2021) 90 capsule 4  . ustekinumab (STELARA) 90 mg/mL injection syringe INJECT 1 SYRINGE UNDER THE SKIN EVERY 6 WEEKS     No current facility-administered medications for this visit.   No Known Allergies Examination   -- General Exam: Vitals unexamined secondary to limitations of encounter (video visit)  -- Neurologic exam:  Mental status: Patient alertness: alert, orientation: person, place, time, affect: normal Speech: fluent  Cranial Nerves:  II: Unexamined secondary to limitations of encounter (video visit) [name],IV,VI: extra-ocular motions intact bilaterally V,VII: no evidence of facial droop or weakness VIII: Unexamined secondary to limitations of encounter (video visit) IX: soft palate elevation  unexamined secondary to limitations of encounter (video visit) IX,X: gag reflex unexamined secondary to limitations of encounter (video visit) XI: trapezius and sternocleidomastoid strength are unexamined secondary to limitations of encounter (video visit)  Motor: Bulk, tone & strength  testing are unexamined secondary to limitations of encounter (video visit) Reflexes: Unexamined secondary to limitations of encounter (video visit) Sensory: Unexamined secondary to limitations of encounter (video visit) Cerebellum/Coordination: finger to nose and gait observed are unexamined secondary to limitations of encounter (video visit) Other: Unexamined secondary to limitations of encounter (video visit)   Relevant Diagnostic Studies   Imaging and Procedures  No (new) imaging studies or procedures relevant to today's encounter have been performed   Available Surveillance Labs Lab Results  Component Value Date   WBC 5.0 11/15/2019   LYMPHCT 2.4 11/15/2019   LYMPHOPCT 47.0 11/15/2019   AST 30 11/15/2019   ALT 30 11/15/2019   VITD 11 (L) 11/15/2019   LABVARI 832 11/15/2019   QUANTIFERON Negative 11/15/2019   HBCAB NonReactive 11/15/2019   HBSAG NonReactive 11/15/2019   HCVAB NonReactive 11/15/2019   IGG 1,760 (H) 11/15/2019   IGA 233 11/15/2019   IGM 133 11/15/2019   IGE 113 11/15/2019     Assessment & Plan     ICD-10-CM   1. MS (multiple sclerosis) (CMS-HCC)  G35     2. Medication management  Z79.899       Ricardo [name] comes in for continued monitoring and management of his relapsing remitting multiple sclerosis that has been clinically stable for the last couple of years while off disease modifying therapy. As I mentioned before, there could be some benefits conveyed by his immunomodulatory therapy for his Crohn's disease, although that is not entirely secure, so we need to supplement his Crohn's treatment with something more specific to multiple sclerosis. We had originally considered natalizumab, but given his JC virus positivity, it would be best for him to get started on glatiramer acetate. We discussed this medication in some detail today. We will have a member of our team walk him through the paperwork he seems on board. We will make plans for a follow-up visit  in 4 months to see how things have gone.  I personally performed the service.  (TP)   F. Jama Coombes, MD MPH Assistant Professor of Neurology Division of Neuroimmunology [facility]  Visit Type: Video This video encounter was conducted with the patient's (or proxy's) verbal consent via secure, interactive audio and video telecommunications while in clinic/office/hospital.  The patient (or proxy) was instructed to have this encounter in a suitably private space and to only have persons present to whom they give permission to participate. In addition, patient identity was confirmed by use of name plus an additional identifier.  Document Attestation: LILLETTE Alfonso Gasman, have reviewed and updated documentation for Ricardo LEE HARTSELL III, MD, utilizing [employer] D[employer].

## 2022-09-30 ENCOUNTER — Emergency Department
Admission: EM | Admit: 2022-09-30 | Discharge: 2022-10-01 | Disposition: A | Discharge status: Home / Self Care | Payer: Managed Care, Other (non HMO) | Attending: Emergency Medicine | Admitting: Emergency Medicine

## 2022-09-30 ENCOUNTER — Other Ambulatory Visit: Payer: Self-pay

## 2022-09-30 DIAGNOSIS — R109 Unspecified abdominal pain: Secondary | ICD-10-CM | POA: Diagnosis present

## 2022-09-30 DIAGNOSIS — E876 Hypokalemia: Secondary | ICD-10-CM | POA: Insufficient documentation

## 2022-09-30 DIAGNOSIS — K5090 Crohn's disease, unspecified, without complications: Secondary | ICD-10-CM | POA: Insufficient documentation

## 2022-09-30 LAB — COMPREHENSIVE METABOLIC PANEL
ALT: 14 U/L (ref 0–44)
AST: 19 U/L (ref 15–41)
Albumin: 3.2 g/dL — ABNORMAL LOW (ref 3.5–5.0)
Alkaline Phosphatase: 53 U/L (ref 38–126)
Anion gap: 9 (ref 5–15)
BUN: 6 mg/dL (ref 6–20)
CO2: 26 mmol/L (ref 22–32)
Calcium: 8.7 mg/dL — ABNORMAL LOW (ref 8.9–10.3)
Chloride: 99 mmol/L (ref 98–111)
Creatinine, Ser: 1.01 mg/dL (ref 0.61–1.24)
GFR, Estimated: >60 mL/min (ref >60)
Glucose, Bld: 111 mg/dL — ABNORMAL HIGH (ref 70–99)
Potassium: 2.9 mmol/L — ABNORMAL LOW (ref 3.5–5.1)
Sodium: 134 mmol/L — ABNORMAL LOW (ref 135–145)
Total Bilirubin: 0.7 mg/dL (ref 0.3–1.2)
Total Protein: 8.2 g/dL — ABNORMAL HIGH (ref 6.5–8.1)

## 2022-09-30 LAB — CBC
HCT: 34.9 % — ABNORMAL LOW (ref 39.0–52.0)
Hemoglobin: 11.1 g/dL — ABNORMAL LOW (ref 13.0–17.0)
MCH: 24 pg — ABNORMAL LOW (ref 26.0–34.0)
MCHC: 31.8 g/dL (ref 30.0–36.0)
MCV: 75.4 fL — ABNORMAL LOW (ref 80.0–100.0)
Platelets: 632 10*3/uL — ABNORMAL HIGH (ref 150–400)
RBC: 4.63 MIL/uL (ref 4.22–5.81)
RDW: 12.8 % (ref 11.5–15.5)
WBC: 6.1 10*3/uL (ref 4.0–10.5)
nRBC: 0 % (ref 0.0–0.2)

## 2022-09-30 LAB — LIPASE, BLOOD: Lipase: 36 U/L (ref 11–51)

## 2022-09-30 MED ORDER — SODIUM CHLORIDE 0.9 % IV BOLUS
1000.0000 mL | Freq: Once | INTRAVENOUS | Status: AC
  Administered 2022-09-30: 1000 mL via INTRAVENOUS

## 2022-09-30 MED ORDER — POTASSIUM CHLORIDE CRYS ER 20 MEQ PO TBCR
40.0000 meq | EXTENDED_RELEASE_TABLET | Freq: Once | ORAL | Status: AC
  Administered 2022-09-30: 40 meq via ORAL
  Filled 2022-09-30: qty 2

## 2022-09-30 MED ORDER — HALOPERIDOL LACTATE 5 MG/ML IJ SOLN
5.0000 mg | Freq: Once | INTRAMUSCULAR | Status: AC
  Administered 2022-09-30: 5 mg via INTRAVENOUS
  Filled 2022-09-30: qty 1

## 2022-09-30 NOTE — ED Provider Notes (Signed)
[facility] Provider Note    Event Date/Time   First MD Initiated Contact with Patient 09/30/22 2305     (approximate)   History   Abdominal Pain (Chrohns Flair - behind on meds since march)   HPI  Ricardo [name] is a 37 y.o. male  who presents to the emergency department today because of concern for crohns flare. The patient had gotten behind on his medication for the past few months although did take a dose a couple of days ago. He says he has been having abdominal pain on and off, and recently it has been accompanied by significant nausea and vomiting which has prevented him from keeping down much oral intake. The patient denies any fevers.     Physical Exam   Triage Vital Signs: ED Triage Vitals  Encounter Vitals Group     BP 09/30/22 2034 (!) 152/100     Systolic BP Percentile --      Diastolic BP Percentile --      Pulse Rate 09/30/22 2034 92     Resp 09/30/22 2034 20     Temp 09/30/22 2034 99.6 F (37.6 C)     Temp Source 09/30/22 2034 Oral     SpO2 09/30/22 2034 97 %     Weight 09/30/22 2035 240 lb (108.9 kg)     Height 09/30/22 2035 5\' 8"  (1.727 m)     Head Circumference --      Peak Flow --      Pain Score 09/30/22 2041 5     Pain Loc --      Pain Education --      Exclude from Growth Chart --     Most recent vital signs: Vitals:   09/30/22 2034  BP: (!) 152/100  Pulse: 92  Resp: 20  Temp: 99.6 F (37.6 C)  SpO2: 97%   General: Awake, alert, oriented. CV:  Good peripheral perfusion. Regular rate and rhythm. Resp:  Normal effort. Lungs clear. Abd:  No distention. Minimally tender diffusely. Soft.    ED Results / Procedures / Treatments   Labs (all labs ordered are listed, but only abnormal results are displayed) Labs Reviewed  COMPREHENSIVE METABOLIC PANEL - Abnormal; Notable for the following components:      Result Value   Sodium 134 (*)    Potassium 2.9 (*)    Glucose, Bld 111 (*)    Calcium 8.7 (*)    Total  Protein 8.2 (*)    Albumin 3.2 (*)    All other components within normal limits  CBC - Abnormal; Notable for the following components:   Hemoglobin 11.1 (*)    HCT 34.9 (*)    MCV 75.4 (*)    MCH 24.0 (*)    Platelets 632 (*)    All other components within normal limits  URINALYSIS, ROUTINE W REFLEX MICROSCOPIC - Abnormal; Notable for the following components:   Color, Urine YELLOW (*)    APPearance CLEAR (*)    All other components within normal limits  LIPASE, BLOOD     EKG  None   RADIOLOGY None  PROCEDURES:  Critical Care performed: No   MEDICATIONS ORDERED IN ED: Medications - No data to display   IMPRESSION / MDM / ASSESSMENT AND PLAN / ED COURSE  I reviewed the triage vital signs and the nursing notes.  Differential diagnosis includes, but is not limited to, crohn's flare, gastroenteritis, pancreatitis  Patient's presentation is most consistent with acute presentation with potential threat to life or bodily function.   The patient is on the cardiac monitor to evaluate for evidence of arrhythmia and/or significant heart rate changes.  Patient presented to the emergency department today because of concerns for abdominal pain, nausea vomiting.  Patient with history of Crohn's disease and this feels like a Crohn's flare.  Blood work without concerning leukocytosis.  Patient was found to have slight hypokalemia.  Was given IV fluids and medication here and did feel improvement.  Was given potassium.  Patient has follow-up already scheduled with his GI doctor in about a week.  Will plan on discharging with prescription for steroids.      FINAL CLINICAL IMPRESSION(S) / ED DIAGNOSES   Final diagnoses:  Acute Crohn's disease without complication (HCC)     Note:  This document was prepared using Dragon voice recognition software and may include unintentional dictation errors.    Phineas Semen, MD 10/01/22 [phone]

## 2022-09-30 NOTE — ED Triage Notes (Signed)
Pt to ed from home via POV for a chrohns flair up. Pt has been non compliant with his prescribed medication for same since march. Pt is caox4, in no acute distress and ambulatory in triage.

## 2022-09-30 NOTE — ED Provider Notes (Incomplete)
[facility] Provider Note    Event Date/Time   First MD Initiated Contact with Patient 09/30/22 2305     (approximate)   History   Abdominal Pain (Chrohns Flair - behind on meds since march)   HPI  Ricardo [name] is a 37 y.o. male  who presents to the emergency department today because of concern for crohns flare. The patient had gotten behind on his medication for the past few months although did take a dose a couple of days ago. He says he has been having abdominal pain on and off, and recently it has been accompanied by significant nausea and vomiting which has prevented him from keeping down much oral intake. The patient denies any fevers.     Physical Exam   Triage Vital Signs: ED Triage Vitals  Encounter Vitals Group     BP 09/30/22 2034 (!) 152/100     Systolic BP Percentile --      Diastolic BP Percentile --      Pulse Rate 09/30/22 2034 92     Resp 09/30/22 2034 20     Temp 09/30/22 2034 99.6 F (37.6 C)     Temp Source 09/30/22 2034 Oral     SpO2 09/30/22 2034 97 %     Weight 09/30/22 2035 240 lb (108.9 kg)     Height 09/30/22 2035 5\' 8"  (1.727 m)     Head Circumference --      Peak Flow --      Pain Score 09/30/22 2041 5     Pain Loc --      Pain Education --      Exclude from Growth Chart --     Most recent vital signs: Vitals:   09/30/22 2034  BP: (!) 152/100  Pulse: 92  Resp: 20  Temp: 99.6 F (37.6 C)  SpO2: 97%   General: Awake, alert, oriented. CV:  Good peripheral perfusion. Regular rate and rhythm. Resp:  Normal effort. Lungs clear. Abd:  No distention. Minimally tender diffusely. Soft.    ED Results / Procedures / Treatments   Labs (all labs ordered are listed, but only abnormal results are displayed) Labs Reviewed  COMPREHENSIVE METABOLIC PANEL - Abnormal; Notable for the following components:      Result Value   Sodium 134 (*)    Potassium 2.9 (*)    Glucose, Bld 111 (*)    Calcium 8.7 (*)    Total  Protein 8.2 (*)    Albumin 3.2 (*)    All other components within normal limits  CBC - Abnormal; Notable for the following components:   Hemoglobin 11.1 (*)    HCT 34.9 (*)    MCV 75.4 (*)    MCH 24.0 (*)    Platelets 632 (*)    All other components within normal limits  LIPASE, BLOOD  URINALYSIS, ROUTINE W REFLEX MICROSCOPIC     EKG  ***   RADIOLOGY *** {USE THE WORD "INTERPRETED"!! You MUST document your own interpretation of imaging, as well as the fact that you reviewed the radiologist's report!:1}   PROCEDURES:  Critical Care performed: Yes  CRITICAL CARE Performed by: Phineas Semen   Total critical care time: *** minutes  Critical care time was exclusive of separately billable procedures and treating other patients.  Critical care was necessary to treat or prevent imminent or life-threatening deterioration.  Critical care was time spent personally by me on the following activities: development of treatment plan with  patient and/or surrogate as well as nursing, discussions with consultants, evaluation of patient's response to treatment, examination of patient, obtaining history from patient or surrogate, ordering and performing treatments and interventions, ordering and review of laboratory studies, ordering and review of radiographic studies, pulse oximetry and re-evaluation of patient's condition.   Procedures    MEDICATIONS ORDERED IN ED: Medications - No data to display   IMPRESSION / MDM / ASSESSMENT AND PLAN / ED COURSE  I reviewed the triage vital signs and the nursing notes.                              Differential diagnosis includes, but is not limited to, ***  Patient's presentation is most consistent with {EM COPA:27473}   ***The patient is on the cardiac monitor to evaluate for evidence of arrhythmia and/or significant heart rate changes.  ***      FINAL CLINICAL IMPRESSION(S) / ED DIAGNOSES   Final diagnoses:  None     Rx /  DC Orders   ED Discharge Orders     None        Note:  This document was prepared using Dragon voice recognition software and may include unintentional dictation errors.

## 2022-10-01 LAB — URINALYSIS, ROUTINE W REFLEX MICROSCOPIC
Bilirubin Urine: NEGATIVE
Glucose, UA: NEGATIVE mg/dL
Hgb urine dipstick: NEGATIVE
Ketones, ur: NEGATIVE mg/dL
Leukocytes,Ua: NEGATIVE
Nitrite: NEGATIVE
Protein, ur: NEGATIVE mg/dL
Specific Gravity, Urine: 1.011 (ref 1.005–1.030)
pH: 6 (ref 5.0–8.0)

## 2022-10-01 MED ORDER — BUDESONIDE ER 6 MG PO CP24: 6.0000 mg | ORAL_CAPSULE | Freq: Every day | ORAL | 0 refills | Status: AC

## 2022-10-01 MED ORDER — BUDESONIDE ER 9 MG PO CP24: 9.0000 mg | ORAL_CAPSULE | Freq: Every day | ORAL | 0 refills | Status: AC

## 2022-10-01 MED ORDER — DICYCLOMINE HCL 10 MG PO CAPS: 10.0000 mg | ORAL_CAPSULE | Freq: Three times a day (TID) | ORAL | 0 refills | Status: DC | PRN

## 2022-10-01 NOTE — Discharge Instructions (Signed)
Please seek medical attention for any high fevers, chest pain, shortness of breath, change in behavior, persistent vomiting, bloody stool or any other new or concerning symptoms.  

## 2022-10-06 NOTE — Progress Notes (Signed)
 GI Clinic Follow-up Note   HPI:  Ricardo [name] is a 37 y.o. year old male with UC and MS  Ricardo [name] was diagnosed with Crohn's colitis in 2005. He did not respond to mesalamine and  did very well on Remicade for many years. 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 It was decided to stop Remicade therapy and he was switched to Entyvio for maintenance of remission of Crohn's disease. After a few months on Entyvio, Ricardo [name] did poorly. His bowels increased to 4 to 5 times per day and were loose. He had diffuse joint pain with morning stiffness and had difficulty functioning. He says that his vision improved but did not returned to normal. In November 2017 he was initiated on [name].  He was improved but not well.  He had an elevated CRP and in April 2018 [name] was increased to 90 mg subQ every 6 weeks and he did well clinically.  A colonoscopy in 2021 showed pseudopolyposis and only mildly active Crohn's disease.  No change in Crohn's therapy was made and Ricardo [name].  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  With this job change he had insurance issues and did not get [name] for about 3 months.  Over this time he developed abdominal pain and his bowels increased about 5 times per day.  He developed significant nausea and vomiting after eating.  He had a recent outside emergency room visit at [facility] and labs were unremarkable but no imaging was performed.  He was treated supportively.  He was finally able to get [name] and took a dose last week and is feeling marginally better.  He has not followed up with his neurologist recently and has not started Copaxone.  He denies any MS symptoms at this time.   ROS:  A complete review of systems was otherwise negative, except as noted in the HPI.  Medications: Current Outpatient  Medications  Medication Sig Dispense Refill  . famotidine  (PEPCID ) 20 mg tablet Take 20 mg by mouth 2 (two) times a day. Indications: gastroesophageal reflux disease    . multivitamin (THERAGRAN) tab tablet Take 1 tablet by mouth Once Daily.    . ustekinumab ([name]) 90 mg/mL injection Inject 1 mL (90 mg total) under the skin Once every six weeks. 1 mL 5  . budesonide  EC (ENTOCORT EC ) 3 mg DR capsule Take 3 capsules (9 mg total) by mouth daily. 90 capsule 1   No current facility-administered medications for this visit.    Physical Exam Blood pressure 141/74, pulse 81, temperature 97.7 F (36.5 C), temperature source Temporal, height 1.727 m (5' 8), weight 104 kg (229 lb 12.8 oz), SpO2 100%. General:  No acute distress, alert and oriented x 3,  hi Abdomen:  Soft, nondistended, nontender, normoactive bowel sounds, no hepatomegaly, no splenomegaly Extremities:  Warm and well-perfused, no clubbing, cyanosis, or edema  Labs: Lab Results  Component Value Date   ALT 21 08/23/2021   AST 23 08/23/2021   BILITOT 0.2 08/23/2021   No results found for: INR, PROTIME Lab Results  Component Value Date   CREATININE 1.20 08/23/2021   BUN 13 08/23/2021   NA 138 08/23/2021   K 4.8 08/23/2021   CL 105 08/23/2021   CO2 27 08/23/2021   Lab Results  Component Value Date   WBC 4.4 08/23/2021  HGB 12.5 (L) 08/23/2021   HCT 37.2 (L) 08/23/2021   MCV 76.7 (L) 08/23/2021   PLT 402 08/23/2021   I reviewed recent outside labs that includes white blood cell count 6.1 hemoglobin 11.1 MCV 75 platelets 632 creatinine 1.01 albumin 3.2 LFTs are normal.   Assessment and Plan: 1 Crohn's disease Ricardo [name] has a long history of Crohn's colitis but developed MS while on anti-TNF therapy.  In recent years he has done well on [name].  He now has active symptoms after being off of [name] due to insurance issues.  I will obtain labs today.  In the setting of continued postprandial vomiting I will obtain  a CT to exclude Crohn's complications such as obstruction or abscess.  If no complications are found we will plan to continue [name] 90 mg every 6 weeks and I have given him an 8-week course of budesonide  9 mg daily to treat symptoms in the short-term.  I will arrange follow-up in 16 weeks but have asked him to contact me if he does not have clear improvement on [name] and budesonide .  Ricardo GORMAN Bouchard, MD

## 2023-02-12 NOTE — Progress Notes (Signed)
 Subjective Patient ID: Ricardo [name] is a 37 y.o. male.  Ricardo [name] is a 37 y.o. male who presents to the clinic c/o lip bump and decreased hearing to the left ear. Symptoms have been going for 2 days. He has used ear was drops and that has not helped. No other c/o today.   History provided by:  Patient  Review of Systems  Constitutional:  Negative for chills, fatigue and fever.  HENT:  Positive for hearing loss. Negative for congestion, ear discharge, ear pain, rhinorrhea and sore throat.        Lip sore  Respiratory:  Negative for cough, chest tightness, shortness of breath and wheezing.   Gastrointestinal:  Negative for abdominal pain, diarrhea, nausea and vomiting.  Musculoskeletal:  Negative for arthralgias and myalgias.  Skin:  Negative for color change, pallor, rash and wound.  Neurological:  Negative for headaches.   Patient History  Allergies: No Known Allergies  History reviewed. No pertinent past medical history. History reviewed. No pertinent surgical history. Social History   Socioeconomic History  . Marital status: Legally Separated    Spouse name: Not on file  . Number of children: Not on file  . Years of education: Not on file  . Highest education level: Not on file  Occupational History  . Not on file  Tobacco Use  . Smoking status: Never  . Smokeless tobacco: Never  Substance and Sexual Activity  . Alcohol use: Not on file  . Drug use: Not on file  . Sexual activity: Not on file  Other Topics Concern  . Not on file  Social History Narrative  . Not on file   History reviewed. No pertinent family history. No current outpatient medications on file prior to visit.   No current facility-administered medications on file prior to visit.   Objective  Vitals:   02/11/23 1509  BP: 136/86  Pulse: 80  Resp: 18  Temp: 36.7 C (98.1 F)  TempSrc: Tympanic  SpO2: 98%  Weight: 111 kg  Height: 5' 7  PainSc: 0-No pain   Physical  Exam  GENERAL APPEARANCE: afebrile, non-toxic and not acutely ill-appearing in NAD. Patient is speaking in full sentences with no increased work of breathing. EYES: EOM intact, PERRLA, with no conjunctival injection noted. EARS: Left TM: no erythema, bulging or effusion noted. Left Ear Canal: Complete cerumen impaction that is clear after irrigation with no erythema, swelling or discharge noted. Right TM: no erythema, bulging or effusion noted. Right Ear Canal: Partial cerumen impaction that is clear after irrigation with no erythema, swelling or discharge noted.  NOSE: Nasal mucosa is without erythema or discharge.  THROAT: No erythema, swelling, or tonsillary exudate noted bilaterally. No soft palate petechiae noted. Uvula is midline and oral mucosa is intact. Cluster of blistering lesions noted to the left lower lip.  NECK: Soft, supple with no cervical adenopathy noted. HEART: RRR, S1 and S2 present with no obvious murmur noted. No rubs, gallops or clicks noted. LUNGS: CTA bilaterally with no wheezing, ronchi, rales or crackles noted.   No results found for this visit on 02/11/23.  Ear Cerumen Removal  Date/Time: 02/11/2023 3:00 PM  Performed by: Fairy Jenny, PA Authorized by: Fairy Jenny, PA   Consent:    Consent obtained:  Verbal   Consent given by:  Patient   Risks discussed:  Bleeding, dizziness, incomplete removal, pain, TM perforation and infection   Alternatives discussed:  No treatment, delayed treatment, alternative treatment, observation and referral  Procedure details:    Location:  L ear and R ear   Procedure type: irrigation   Post-procedure details:    Inspection:  TM intact   Hearing quality:  Normal   Patient tolerance of procedure:  Tolerated well, no immediate complications  MDM:     1 Acute illness with systemic symptoms     Explanation of Medical Decision Making and variances from expected care:    Meds as ordered. Advised on ways to keep the ears  clear. Labs ordered, will call with results. See PCP soon. F/U as needed.    Assessment requiring historian other than patient: No     Independent visualization of image, tracing, or test: No     Discussion of management with another provider: No     Risk:: Moderate     Assessment/Plan Diagnoses and all orders for this visit:  Cold sore -     HSV PCR -     valACYclovir (Valtrex) 1 g tablet; Take 2 tablets (2,000 mg total) by mouth in the morning and 2 tablets (2,000 mg total) in the evening. Do all this for 1 day.  Bilateral impacted cerumen   Disposition Status: Home  Progress note signed by Fairy Jenny, PA on 02/12/23 at  9:01 AM

## 2023-09-04 NOTE — Addendum Note (Signed)
 Addended by: STEWART, ALYSSA P on: 09/04/2023 12:34 PM  Modules accepted: Orders

## 2023-09-04 NOTE — Telephone Encounter (Signed)
 I will follow up with [facility] because the current Rx is DAW= 1 Brand Stelara and they should not be substituting at this time since plan is not mandating.  However, looking at a past note (12/20/22) from Kristin Peele, authorization [id number] with approved dates 12/22/22-12/22/23 IS the PLA approval. The actual drug approval is #[id number] with approved dates 09/01/22-09/01/23  So it looks like the PA for the drug itself may be due for re-auth.

## 2023-11-23 ENCOUNTER — Ambulatory Visit

## 2023-11-23 VITALS — BP 132/82 | HR 65 | Ht 68.0 in | Wt 256.0 lb

## 2023-11-23 DIAGNOSIS — E66813 Obesity, class 3: Secondary | ICD-10-CM | POA: Diagnosis not present

## 2023-11-23 DIAGNOSIS — Z6841 Body Mass Index (BMI) 40.0 and over, adult: Secondary | ICD-10-CM

## 2023-11-23 DIAGNOSIS — I10 Essential (primary) hypertension: Secondary | ICD-10-CM | POA: Insufficient documentation

## 2023-11-23 DIAGNOSIS — G35 Multiple sclerosis: Secondary | ICD-10-CM

## 2023-11-23 DIAGNOSIS — K5010 Crohn's disease of large intestine without complications: Secondary | ICD-10-CM | POA: Diagnosis not present

## 2023-11-23 DIAGNOSIS — G47429 Narcolepsy in conditions classified elsewhere without cataplexy: Secondary | ICD-10-CM

## 2023-11-23 DIAGNOSIS — K219 Gastro-esophageal reflux disease without esophagitis: Secondary | ICD-10-CM

## 2023-11-23 DIAGNOSIS — S6010XA Contusion of unspecified finger with damage to nail, initial encounter: Secondary | ICD-10-CM | POA: Insufficient documentation

## 2023-11-23 NOTE — Progress Notes (Signed)
 New Patient Visit   Physician: Muhamed Luecke A Jaquitta Dupriest, MD  Patient: Ricardo [name]   DOB: [birth year]   38 y.o. Male  MRN: [id number] Visit Date: 11/23/2023   Chief Complaint  Patient presents with   Establish Care   Nail Problem    Left thumb/Right great toe, black/damaged nail; denies injury   Subjective  Ricardo [name] is a 38 y.o. male who presents today as a new patient to establish care.   HPI  Discussed the use of AI scribe software for clinical note transcription with the patient, who gave verbal consent to proceed.  History of Present Illness   Ricardo [name] is a 38 year old male with multiple sclerosis and Crohn's disease who presents for a new patient visit and comprehensive evaluation.  Neurological symptoms - Diagnosed with multiple sclerosis in 2017 following left eye vision loss due to optic neuritis; MRI at that time showed findings suggestive of MS - No current vision changes, weakness, or pain - No recent MS exacerbations - Not on medication for MS and has not seen a neurologist in the past couple of years - In 2021, experienced numbness from right shoulder to fingertips, non-painful and noticeable  - No chest pain, palpitations, or other systemic or neurological complaints  Gastrointestinal symptoms - Diagnosed with Crohn's disease - Currently managed with Stelara; not on budesonide  or other medications for Crohn's - No complications or issues related to Crohn's disease - Continues follow-up with gastroenterologist - Occasional heartburn, effectively managed with Pepcid   Dermatologic findings - Black discoloration in fingernail and right big toe and 1st digit right hand for the past year - No history of trauma to affected areas, no resolution of area  Obesity, BMI 38.9 - Consumes a Southern diet and does not exercise regularly  - No regular exercise  GERD - Occasional use of over-the-counter aspirin plus and Pepcid  for heartburn   - Occasional  marijuana use       Does not take influenza vaccine  ASSESSMENT & PLAN  Encounter Diagnoses  Name Primary?   Crohn's disease of colon without complication (HCC) Yes   Multiple sclerosis (HCC)    Narcolepsy due to underlying condition without cataplexy    Class 3 severe obesity due to excess calories without serious comorbidity with body mass index (BMI) of 45.0 to 49.9 in adult    Subungual hematoma of finger of left hand, initial encounter    Gastroesophageal reflux disease without esophagitis     Orders Placed This Encounter  Procedures   CBC with Differential/Platelet   Comprehensive metabolic panel with GFR   Hemoglobin A1c   Lipid panel   Urinalysis, Routine w reflex microscopic   Ambulatory referral to Dermatology    Assessment and Plan    Crohn's disease managed with Stelara Crohn's disease is well-managed with Stelara. No complications or issues reported. - Order blood work to monitor blood counts and kidney function due to immunosuppressive nature of Stelara, which increases risk for infections such as pneumonia and fungal infections. - Discuss with gastroenterologist regarding ongoing fu  Multiple sclerosis, stable Multiple sclerosis is well-controlled with no current symptoms or exacerbations. Previous symptoms included optic neuritis and numbness in the right arm. - Encourage follow-up with neurologist for ongoing monitoring.  Hyperpigmentation f fingernail and right great toenail with atypical appearance Onychomycosis with atypical appearance in fingernail and right great toenail, present for over a year without resolution. Differential diagnosis includes fungal infection and possible hyperpigmentation due to  Stelara. - Refer to dermatology for evaluation and atypical nail appearance to rule out melanoma and assess the need for further intervention.  Elevated blood pressure, monitoring for hypertension Blood pressure is slightly elevated at 132/82 mmHg. Family  history of hypertension and diabetes. No home blood pressure monitoring currently. - Monitor blood pressure at pharmacy or during office visits. - Encourage lifestyle modifications to manage blood pressure, including diet and exercise.  Obesity, class 3 Obesity is present. - Encourage lifestyle modifications, including increased physical activity and dietary changes.  Gastroesophageal reflux symptoms Gastroesophageal reflux symptoms are managed with over-the-counter Pepcid , which is effective. - Continue current management with over-the-counter Pepcid .        Fu in 3 weeks to review labs     Objective  BP 132/82   Pulse 65   Ht 5' 8 (1.727 m)   Wt 256 lb (116.1 kg)   SpO2 98%   BMI 38.92 kg/m      Review of Systems  Constitutional:  Negative for chills, fever and weight loss.  Eyes:  Negative for blurred vision. h Respiratory:  Negative for cough and shortness of breath.   Cardiovascular:  Negative for chest pain and palpitations.  Skin:  Negative for rash.  Psychiatric/Behavioral:  Negative for depression. The patient is not nervous/anxious.      Physical Exam Physical Exam Vitals reviewed.  Constitutional:      Appearance: Normal appearance. Well-developed with normal weight.  HENT:     Head: Normocephalic and atraumatic.  Normal mucous membranes, no oral lesions Eyes:     Pupils: Pupils are equal, round, and reactive to light.  Neck:     Thyroid: No thyroid mass or thyromegaly.  Cardiovascular:     Rate and Rhythm: Normal rate and regular rhythm. Normal heart sounds. Normal peripheral pulses Pulmonary:     Normal breath sounds with normal effort Abdominal:   Abdomen is soft, without tenderness or noted hepatosplenomegaly Musculoskeletal:        General: No swelling or edema  Lymphadenopathy:     Cervical: No cervical adenopathy.  Skin:    General: Skin is warm and dry without noticeable rash. Neurological:     General: No focal deficit present.   Psychiatric:        Mood and Affect: Mood, behavior and cognition normal   Past Medical History:  Diagnosis Date   Asthma    Crohn disease (HCC)    GERD (gastroesophageal reflux disease)    Narcolepsy    Past Surgical History:  Procedure Laterality Date   TONSILLECTOMY     Family Status  Relation Name Status   Mother Waldo Molt (Not Specified)   Father Loc Feinstein (Not Specified)   Sister Thersia Croak (Not Specified)  No partnership data on file   Family History  Problem Relation Age of Onset   Hyperlipidemia Mother    Hypertension Mother    Diabetes Mother    Diabetes Father    Hyperlipidemia Father    Hypertension Father    Diabetes Sister    Social History   Socioeconomic History   Marital status: Married    Spouse name: Not on file   Number of children: Not on file   Years of education: Not on file   Highest education level: Associate degree: academic program  Occupational History   Not on file  Tobacco Use   Smoking status: Former    Current packs/day: 0.25    Average packs/day: 0.3 packs/day for 15.0 years (3.8  ttl pk-yrs)    Types: Cigars, E-cigarettes, Cigarettes   Smokeless tobacco: Current  Vaping Use   Vaping status: Every Day  Substance and Sexual Activity   Alcohol use: Yes    Alcohol/week: 2.0 standard drinks of alcohol    Types: 2 Shots of liquor per week   Drug use: Not Currently    Types: Marijuana   Sexual activity: Yes    Birth control/protection: None  Other Topics Concern   Not on file  Social History Narrative   Not on file   Social Drivers of Health   Financial Resource Strain: Low Risk  (11/22/2023)   Overall Financial Resource Strain (CARDIA)    Difficulty of Paying Living Expenses: Not hard at all  Food Insecurity: No Food Insecurity (11/22/2023)   Hunger Vital Sign    Worried About Running Out of Food in the Last Year: Never true    Ran Out of Food in the Last Year: Never true  Transportation Needs: No  Transportation Needs (11/22/2023)   PRAPARE - Administrator, Civil Service (Medical): No    Lack of Transportation (Non-Medical): No  Physical Activity: Insufficiently Active (11/22/2023)   Exercise Vital Sign    Days of Exercise per Week: 2 days    Minutes of Exercise per Session: 20 min  Stress: No Stress Concern Present (11/22/2023)   [employer] of Occupational Health - Occupational Stress Questionnaire    Feeling of Stress: Not at all  Social Connections: Moderately Isolated (11/22/2023)   Social Connection and Isolation Panel    Frequency of Communication with Friends and Family: Once a week    Frequency of Social Gatherings with Friends and Family: Once a week    Attends Religious Services: More than 4 times per year    Active Member of [employer] or Organizations: Yes    Attends Engineer, structural: More than 4 times per year    Marital Status: Separated   Outpatient Medications Prior to Visit  Medication Sig   STELARA 90 MG/ML SOSY injection Inject 90 mg into the skin.   [DISCONTINUED] acetaminophen  (RA ACETAMINOPHEN ) 650 MG CR tablet Take 650 mg by mouth every 8 (eight) weeks. With infusion (Patient not taking: Reported on 09/30/2022)   [DISCONTINUED] dicyclomine  (BENTYL ) 10 MG capsule Take 1 capsule (10 mg total) by mouth 3 (three) times daily as needed (abdominal pain).   [DISCONTINUED] oxyCODONE -acetaminophen  (ROXICET) 5-325 MG tablet Take 1 tablet by mouth every 4 (four) hours as needed for severe pain. (Patient not taking: Reported on 10/05/2019)   No facility-administered medications prior to visit.   No Known Allergies  Immunization History  Administered Date(s) Administered   Hepatitis B, PED/ADOLESCENT 12/24/1997, 01/28/1998, 07/01/1998   Tdap 01/27/2015    Health Maintenance  Topic Date Due   Hepatitis C Screening  Never done   HPV VACCINES (1 - 3-dose SCDM series) Never done   DTaP/Tdap/Td (2 - Td or Tdap) 01/26/2025   Hepatitis B Vaccines  19-59 Average Risk  Completed   HIV Screening  Completed   Pneumococcal Vaccine  Aged Out   Meningococcal B Vaccine  Aged Out   INFLUENZA VACCINE  Discontinued   COVID-19 Vaccine  Discontinued    Patient Care Team: [facility] as PCP - General  Depression Screen     No data to display           Parris DELENA Juneau, MD  [facility] [phone] (phone) [phone] (fax)  [facility]

## 2023-11-24 LAB — HEMOGLOBIN A1C
Hgb A1c MFr Bld: 5.6 % (ref <5.7)
Mean Plasma Glucose: 114 mg/dL
eAG (mmol/L): 6.3 mmol/L

## 2023-11-24 LAB — COMPREHENSIVE METABOLIC PANEL WITH GFR
AG Ratio: 1.1 (calc) (ref 1.0–2.5)
ALT: 22 U/L (ref 9–46)
AST: 28 U/L (ref 10–40)
Albumin: 4.3 g/dL (ref 3.6–5.1)
Alkaline phosphatase (APISO): 68 U/L (ref 36–130)
BUN: 15 mg/dL (ref 7–25)
CO2: 28 mmol/L (ref 20–32)
Calcium: 9.4 mg/dL (ref 8.6–10.3)
Chloride: 103 mmol/L (ref 98–110)
Creat: 1.11 mg/dL (ref 0.60–1.26)
Globulin: 3.8 g/dL — ABNORMAL HIGH (ref 1.9–3.7)
Glucose, Bld: 90 mg/dL (ref 65–99)
Potassium: 4.6 mmol/L (ref 3.5–5.3)
Sodium: 138 mmol/L (ref 135–146)
Total Bilirubin: 0.4 mg/dL (ref 0.2–1.2)
Total Protein: 8.1 g/dL (ref 6.1–8.1)
eGFR: 88 mL/min/1.73m2 (ref >=60)

## 2023-11-24 LAB — URINALYSIS, ROUTINE W REFLEX MICROSCOPIC
Bacteria, UA: NONE SEEN /HPF
Bilirubin Urine: NEGATIVE
Glucose, UA: NEGATIVE
Hgb urine dipstick: NEGATIVE
Hyaline Cast: NONE SEEN /LPF
Ketones, ur: NEGATIVE
Leukocytes,Ua: NEGATIVE
Nitrite: NEGATIVE
RBC / HPF: NONE SEEN /HPF (ref 0–2)
Specific Gravity, Urine: 1.025 (ref 1.001–1.035)
Squamous Epithelial / HPF: NONE SEEN /HPF (ref <=5)
WBC, UA: NONE SEEN /HPF (ref 0–5)
pH: 5.5 (ref 5.0–8.0)

## 2023-11-24 LAB — CBC WITH DIFFERENTIAL/PLATELET
Absolute Lymphocytes: 2501 {cells}/uL (ref 850–3900)
Absolute Monocytes: 322 {cells}/uL (ref 200–950)
Basophils Absolute: 48 {cells}/uL (ref 0–200)
Basophils Relative: 1 %
Eosinophils Absolute: 110 {cells}/uL (ref 15–500)
Eosinophils Relative: 2.3 %
HCT: 36.9 % — ABNORMAL LOW (ref 38.5–50.0)
Hemoglobin: 11.4 g/dL — ABNORMAL LOW (ref 13.2–17.1)
MCH: 23.1 pg — ABNORMAL LOW (ref 27.0–33.0)
MCHC: 30.9 g/dL — ABNORMAL LOW (ref 32.0–36.0)
MCV: 74.7 fL — ABNORMAL LOW (ref 80.0–100.0)
MPV: 8.8 fL (ref 7.5–12.5)
Monocytes Relative: 6.7 %
Neutro Abs: 1819 {cells}/uL (ref 1500–7800)
Neutrophils Relative %: 37.9 %
Platelets: 364 Thousand/uL (ref 140–400)
RBC: 4.94 Million/uL (ref 4.20–5.80)
RDW: 14.9 % (ref 11.0–15.0)
Total Lymphocyte: 52.1 %
WBC: 4.8 Thousand/uL (ref 3.8–10.8)

## 2023-11-24 LAB — LIPID PANEL
Cholesterol: 152 mg/dL (ref <200)
HDL: 41 mg/dL (ref >=40)
LDL Cholesterol (Calc): 96 mg/dL
Non-HDL Cholesterol (Calc): 111 mg/dL (ref <130)
Total CHOL/HDL Ratio: 3.7 (calc) (ref <5.0)
Triglycerides: 60 mg/dL (ref <150)

## 2023-11-24 LAB — MICROSCOPIC MESSAGE

## 2023-12-12 NOTE — Progress Notes (Unsigned)
            Progress Note  Physician: Javeria Briski A Dail Meece, MD   HPI: Ricardo [name] is a 38 y.o. male presenting on 12/13/2023 for No chief complaint on file. .  Discussed the use of AI scribe software for clinical note transcription with the patient, who gave verbal consent to proceed.  History of Present Illness       Patient generally feeling well and has no complaints.  Lab results overall unremarkable.  He has normal LFTs and overall normal renal function.  CBC indicating mild anemia.  Patient does have a history of iron deficiency anemia.  No change in bowel habits.  He does have a history of optic neuritis for which she will follow-up with ophthalmology next year.  We have sent him to dermatology for treatment for onychomycosis which seems to be fairly severe over his fingernail on the hand and toenails.  Otherwise patient will continue to see gastroenterology.  He remains on Stelara for treatment of Crohn's.  Blood pressure today 142/90.  He does tend to run lower at home.    Medical history:  Relevant past medical, surgical, family and social history reviewed and updated as indicated. Interim medical history since our last visit reviewed.  Allergies and medications reviewed and updated.   ROS: Negative unless specifically indicated above in HPI.    Current Outpatient Medications:    STELARA 90 MG/ML SOSY injection, Inject 90 mg into the skin., Disp: , Rfl:        Objective:     There were no vitals taken for this visit.  Wt Readings from Last 3 Encounters:  11/23/23 256 lb (116.1 kg)  09/30/22 238 lb 1.6 oz (108 kg)  10/04/19 289 lb 12.8 oz (131.5 kg)    Physical Exam  Physical Exam Vitals reviewed.  Constitutional:      Appearance: Normal appearance. Well-developed with normal weight.  Cardiovascular:     Rate and Rhythm: Normal rate and regular rhythm. Normal heart sounds. Normal peripheral pulses Pulmonary:     Normal breath sounds with normal  effort Skin:    General: Skin is warm and dry without noticeable rash.   Neurological:     General: No focal deficit present.  Psychiatric:        Mood and Affect: Mood, behavior and cognition normal      Assessment & Plan:  No diagnosis found.  No orders of the defined types were placed in this encounter.    Assessment and Plan     #1 chron's disease.  Seems to be stable with Stelara without complications.  Plan to repeat labs in 6 months to follow-up on very mild anemia.  2.  Hypertension.  Monitor blood pressure at home.  If he remains elevated we may need to consider ongoing treatment.  Did discuss diet and exercise.  3.  Optic neuritis history.  Recommend follow-up with ophthalmology this year.

## 2023-12-13 ENCOUNTER — Ambulatory Visit

## 2023-12-13 VITALS — BP 130/80 | HR 63 | Ht 68.0 in | Wt 262.0 lb

## 2023-12-13 DIAGNOSIS — B351 Tinea unguium: Secondary | ICD-10-CM | POA: Insufficient documentation

## 2023-12-13 DIAGNOSIS — K5010 Crohn's disease of large intestine without complications: Secondary | ICD-10-CM

## 2023-12-13 DIAGNOSIS — D649 Anemia, unspecified: Secondary | ICD-10-CM | POA: Diagnosis not present

## 2023-12-13 DIAGNOSIS — S6010XA Contusion of unspecified finger with damage to nail, initial encounter: Secondary | ICD-10-CM

## 2023-12-14 ENCOUNTER — Telehealth: Payer: Self-pay

## 2023-12-14 NOTE — Telephone Encounter (Signed)
 Copied from CRM #[id number]. Topic: Referral - Request for Referral >> Dec 14, 2023  9:34 AM Kendralyn S wrote: Did the patient discuss referral with their provider in the last year? Yes (If No - schedule appointment) (If Yes - send message)  Appointment offered? No  Type of order/referral and detailed reason for visit: Called to verify he wants a referral to Dermatology for his nail issues in case its something more than a prescription can take care of.  Preference of office, provider, location: did not discuss  If referral order, have you been seen by this specialty before? No (If Yes, this issue or another issue? When? Where?  Can we respond through MyChart? Yes

## 2023-12-14 NOTE — Telephone Encounter (Signed)
 Left message for patient to return call OK to advise

## 2024-01-08 ENCOUNTER — Ambulatory Visit: Admitting: Dermatology

## 2024-01-08 ENCOUNTER — Encounter: Payer: Self-pay | Admitting: Dermatology

## 2024-01-08 DIAGNOSIS — B351 Tinea unguium: Secondary | ICD-10-CM | POA: Diagnosis not present

## 2024-01-08 DIAGNOSIS — Z7189 Other specified counseling: Secondary | ICD-10-CM | POA: Diagnosis not present

## 2024-01-08 MED ORDER — TERBINAFINE HCL 250 MG PO TABS: 250.0000 mg | ORAL_TABLET | Freq: Every day | ORAL | 2 refills | Status: AC

## 2024-01-08 NOTE — Patient Instructions (Addendum)
 Start Terbinafine 250 mg 1 tablet daily for 3 months.  Terbinafine Counseling  Terbinafine is an anti-fungal medicine that can be applied to the skin (over the counter) or taken by mouth (prescription) to treat fungal infections. The pill version is often used to treat fungal infections of the nails or scalp. While most people do not have any side effects from taking terbinafine pills, some possible side effects of the medicine can include taste changes, headache, loss of smell, vision changes, nausea, vomiting, or diarrhea.   Rare side effects can include irritation of the liver, allergic reaction, or decrease in blood counts (which may show up as not feeling well or developing an infection). If you are concerned about any of these side effects, please stop the medicine and call your doctor, or in the case of an emergency such as feeling very unwell, seek immediate medical care.       Due to recent changes in healthcare laws, you may see results of your pathology and/or laboratory studies on MyChart before the doctors have had a chance to review them. We understand that in some cases there may be results that are confusing or concerning to you. Please understand that not all results are received at the same time and often the doctors may need to interpret multiple results in order to provide you with the best plan of care or course of treatment. Therefore, we ask that you please give us  2 business days to thoroughly review all your results before contacting the office for clarification. Should we see a critical lab result, you will be contacted sooner.   If You Need Anything After Your Visit  If you have any questions or concerns for your doctor, please call our main line at [phone] and press option 4 to reach your doctor's medical assistant. If no one answers, please leave a voicemail as directed and we will return your call as soon as possible. Messages left after 4 pm will be answered the  following business day.   You may also send us  a message via MyChart. We typically respond to MyChart messages within 1-2 business days.  For prescription refills, please ask your pharmacy to contact our office. Our fax number is [phone].  If you have an urgent issue when the clinic is closed that cannot wait until the next business day, you can page your doctor at the number below.    Please note that while we do our best to be available for urgent issues outside of office hours, we are not available 24/7.   If you have an urgent issue and are unable to reach us , you may choose to seek medical care at your doctor's office, retail clinic, urgent care center, or emergency room.  If you have a medical emergency, please immediately call 911 or go to the emergency department.  Pager Numbers  - Dr. Hester: [phone]  - Dr. Jackquline: [phone]  - Dr. Claudene: [phone]   - Dr. Raymund: [phone]  In the event of inclement weather, please call our main line at [phone] for an update on the status of any delays or closures.  Dermatology Medication Tips: Please keep the boxes that topical medications come in in order to help keep track of the instructions about where and how to use these. Pharmacies typically print the medication instructions only on the boxes and not directly on the medication tubes.   If your medication is too expensive, please contact our office at [phone] option 4 or  send us  a message through MyChart.   We are unable to tell what your co-pay for medications will be in advance as this is different depending on your insurance coverage. However, we may be able to find a substitute medication at lower cost or fill out paperwork to get insurance to cover a needed medication.   If a prior authorization is required to get your medication covered by your insurance company, please allow us  1-2 business days to complete this process.  Drug prices often vary  depending on where the prescription is filled and some pharmacies may offer cheaper prices.  The website www.goodrx.com contains coupons for medications through different pharmacies. The prices here do not account for what the cost may be with help from insurance (it may be cheaper with your insurance), but the website can give you the price if you did not use any insurance.  - You can print the associated coupon and take it with your prescription to the pharmacy.  - You may also stop by our office during regular business hours and pick up a GoodRx coupon card.  - If you need your prescription sent electronically to a different pharmacy, notify our office through [facility] or by phone at [phone] option 4.     Si Usted Necesita Algo Despus de Su Visita  Tambin puede enviarnos un mensaje a travs de Clinical cytogeneticist. Por lo general respondemos a los mensajes de MyChart en el transcurso de 1 a 2 das hbiles.  Para renovar recetas, por favor pida a su farmacia que se ponga en contacto con nuestra oficina. Randi lakes de fax es [place] [phone].  Si tiene un asunto urgente cuando la clnica est cerrada y que no puede esperar hasta el siguiente da hbil, puede llamar/localizar a su doctor(a) al nmero que aparece a continuacin.   Por favor, tenga en cuenta que aunque hacemos todo lo posible para estar disponibles para asuntos urgentes fuera del horario de [place], no estamos disponibles las 24 horas del da, los 7 [address]  [address] de la [place].   Si tiene un problema urgente y no puede comunicarse con nosotros, puede optar por buscar atencin mdica  en el consultorio de su doctor(a), en una clnica privada, en un centro de atencin urgente o en una sala de emergencias.  Si tiene Engineer, drilling, por favor llame inmediatamente al 911 o vaya a la sala de emergencias.  Nmeros de bper  - Dr. Hester: [phone]  - Dra. Jackquline: [phone]  - Dr. Claudene: [phone]  - Dra.  Kitts: [phone]  En caso de inclemencias del [place], por favor llame a nuestra lnea principal al [phone] para una actualizacin sobre el estado de cualquier retraso o cierre.  Consejos para la medicacin en dermatologa: Por favor, guarde las cajas en las que vienen los medicamentos de uso tpico para ayudarle a seguir las instrucciones sobre dnde y cmo usarlos. Las farmacias generalmente imprimen las instrucciones del medicamento slo en las cajas y no directamente en los tubos del [place].   Si su medicamento es muy caro, por favor, pngase en contacto con landry rieger llamando al [phone] y presione la opcin 4 o envenos un mensaje a travs de Clinical cytogeneticist.   No podemos decirle cul ser su copago por los medicamentos por adelantado ya que esto es diferente dependiendo de la cobertura de su seguro. Sin embargo, es posible que podamos encontrar un medicamento sustituto a Audiological scientist un formulario para que el seguro cubra el medicamento que se  considera necesario.   Si se requiere una autorizacin previa para que su compaa de seguros malta su medicamento, por favor permtanos de 1 a 2 das hbiles para completar este proceso.  Los precios de los medicamentos varan con frecuencia dependiendo del Environmental consultant de dnde se surte la receta y alguna farmacias pueden ofrecer precios ms baratos.  El sitio web www.goodrx.com tiene cupones para medicamentos de Health and safety inspector. Los precios aqu no tienen en cuenta lo que podra costar con la ayuda del seguro (puede ser ms barato con su seguro), pero el sitio web puede darle el precio si no utiliz Tourist information centre manager.  - Puede imprimir el cupn correspondiente y llevarlo con su receta a la farmacia.  - Tambin puede pasar por nuestra oficina durante el horario de atencin regular y Education officer, museum una tarjeta de cupones de GoodRx.  - Si necesita que su receta se enve electrnicamente a una farmacia diferente, informe a nuestra oficina a  travs de MyChart de [place] o por telfono llamando al [phone] y presione la opcin 4.

## 2024-01-08 NOTE — Progress Notes (Signed)
   New Patient Visit   Subjective  Ricardo [name] is a 38 y.o. male who presents for the following: L thumbnail and R great toenail. Having similar issues with each nail. Dur: years for toenail. Dur: >1 year for thumb nail. No hx of treatment.  On Stelara 4-5 years for Crohn's disease. Controls well.    The following portions of the chart were reviewed this encounter and updated as appropriate: medications, allergies, medical history  Review of Systems:  No other skin or systemic complaints except as noted in HPI or Assessment and Plan.  Objective  Well appearing patient in no apparent distress; mood and affect are within normal limits.  A focused examination was performed of the following areas: L thumbnail, R great toenail  Relevant exam findings are noted in the Assessment and Plan.            Assessment & Plan   ONYCHOMYCOSIS Exam: Thickened dystrophic R great toenail and L thumbnail with subungal debris c/w onychomycosis  Chronic and persistent condition with duration or expected duration over one year. Condition is symptomatic/ bothersome to patient. Not currently at goal.  Treatment Plan:  Fingernails grow in 6 m, toenails 18 m, look for prox nail clearing Reduce recurrence by wearing dry socks, footwear in public pools, alternating shoes daily Start Terbinafine 250 mg 1 tablet daily for 3 months.  Terbinafine Counseling  Terbinafine is an anti-fungal medicine that can be applied to the skin (over the counter) or taken by mouth (prescription) to treat fungal infections. The pill version is often used to treat fungal infections of the nails or scalp. While most people do not have any side effects from taking terbinafine pills, some possible side effects of the medicine can include taste changes, headache, loss of smell, vision changes, nausea, vomiting, or diarrhea.   Rare side effects can include irritation of the liver, allergic reaction, or decrease in blood  counts (which may show up as not feeling well or developing an infection). If you are concerned about any of these side effects, please stop the medicine and call your doctor, or in the case of an emergency such as feeling very unwell, seek immediate medical care.   ONYCHOMYCOSIS    Return in about 6 months (around 07/08/2024) for onychomycosis follow up.  I, [name], CMA, am acting as scribe for [name], MD.   Documentation: I have reviewed the above documentation for accuracy and completeness, and I agree with the above.  [name], MD

## 2024-01-30 ENCOUNTER — Encounter (INDEPENDENT_AMBULATORY_CARE_PROVIDER_SITE_OTHER): Payer: Self-pay

## 2024-07-01 ENCOUNTER — Other Ambulatory Visit

## 2024-07-08 ENCOUNTER — Ambulatory Visit: Admitting: Dermatology

## 2024-07-12 ENCOUNTER — Ambulatory Visit
